# Patient Record
Sex: Female | Born: 2000 | Race: Black or African American | Hispanic: No | Marital: Single | Smoking: Never smoker
Health system: Southern US, Community
[De-identification: ages and names within clinical notes are randomized; demographics above are authoritative.]

## PROBLEM LIST (undated history)

## (undated) DIAGNOSIS — I10 Essential (primary) hypertension: Secondary | ICD-10-CM

## (undated) DIAGNOSIS — J45909 Unspecified asthma, uncomplicated: Secondary | ICD-10-CM

## (undated) HISTORY — PX: HERNIA REPAIR: SHX51

---
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
## 2019-01-31 DIAGNOSIS — B3731 Acute candidiasis of vulva and vagina: Secondary | ICD-10-CM | POA: Insufficient documentation

## 2020-11-22 DIAGNOSIS — Z3686 Encounter for antenatal screening for cervical length: Secondary | ICD-10-CM | POA: Diagnosis not present

## 2020-11-22 DIAGNOSIS — O26872 Cervical shortening, second trimester: Secondary | ICD-10-CM | POA: Diagnosis not present

## 2020-11-22 DIAGNOSIS — Z362 Encounter for other antenatal screening follow-up: Secondary | ICD-10-CM | POA: Diagnosis not present

## 2020-11-22 DIAGNOSIS — Z3A23 23 weeks gestation of pregnancy: Secondary | ICD-10-CM | POA: Diagnosis not present

## 2020-12-06 DIAGNOSIS — Z3686 Encounter for antenatal screening for cervical length: Secondary | ICD-10-CM | POA: Diagnosis not present

## 2020-12-06 DIAGNOSIS — O26872 Cervical shortening, second trimester: Secondary | ICD-10-CM | POA: Diagnosis not present

## 2020-12-06 DIAGNOSIS — Z3A25 25 weeks gestation of pregnancy: Secondary | ICD-10-CM | POA: Diagnosis not present

## 2020-12-08 ENCOUNTER — Ambulatory Visit
Admission: EM | Admit: 2020-12-08 | Discharge: 2020-12-08 | Disposition: A | Discharge status: Home / Self Care | Payer: 59

## 2020-12-08 ENCOUNTER — Ambulatory Visit: Payer: Self-pay

## 2020-12-08 ENCOUNTER — Other Ambulatory Visit: Payer: Self-pay

## 2020-12-08 DIAGNOSIS — J069 Acute upper respiratory infection, unspecified: Secondary | ICD-10-CM | POA: Diagnosis not present

## 2020-12-08 HISTORY — DX: Unspecified asthma, uncomplicated: J45.909

## 2020-12-08 MED ORDER — FLUTICASONE PROPIONATE 50 MCG/ACT NA SUSP: 1.0000 | Freq: Every day | NASAL | 0 refills | Status: DC

## 2020-12-08 MED ORDER — CETIRIZINE HCL 10 MG PO CAPS: 10.0000 mg | ORAL_CAPSULE | Freq: Every day | ORAL | 0 refills | Status: DC

## 2020-12-08 NOTE — Discharge Instructions (Signed)
Begin daily cetirizine, Flonase nasal spray to help with congestion and drainage, sinus inflammation May use other over-the-counter medicines as needed using list provided Tylenol as needed for headache, sore throat Rest and fluids Follow-up if not improving or worsening
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
## 2020-12-08 NOTE — ED Provider Notes (Signed)
[facility]    CSN: [id number] Arrival date & time: 12/08/20  0835      History   Chief Complaint Chief Complaint  Patient presents with  . Sore Throat    HPI Katherine Parrish is a 20 y.o. female approximately [redacted] weeks pregnant presenting today for evaluation of sore throat.  Reports began to develop postnasal drainage, sore throat congestion and sinus pressure yesterday.  Reports similar symptoms for approximately 4 days approximately 1 week ago.  Denies any known fevers.  Denies any significant cough chest pain or shortness of breath.   HPI  Past Medical History:  Diagnosis Date  . Asthma     There are no problems to display for this patient.   Past Surgical History:  Procedure Laterality Date  . HERNIA REPAIR      OB History    Gravida  1   Para      Term      Preterm      AB      Living        SAB      IAB      Ectopic      Multiple      Live Births               Home Medications    Prior to Admission medications   Medication Sig Start Date End Date Taking? Authorizing Provider  albuterol (VENTOLIN HFA) 108 (90 Base) MCG/ACT inhaler Inhale into the lungs every 6 (six) hours as needed for wheezing or shortness of breath.   Yes [provider]  Cetirizine HCl 10 MG CAPS Take 1 capsule (10 mg total) by mouth daily for 10 days. 12/08/20 12/18/20 Yes Rumor Sun C, PA-C  fluticasone (FLONASE) 50 MCG/ACT nasal spray Place 1-2 sprays into both nostrils daily. 12/08/20  Yes Thaddeaus Monica C, PA-C  montelukast (SINGULAIR) 10 MG tablet Take 10 mg by mouth at bedtime.   Yes [provider]  Prenatal Vit-Fe Fumarate-FA (PRENATAL MULTIVITAMIN) TABS tablet Take 1 tablet by mouth daily at 12 noon.   Yes [provider]    Family History History reviewed. No pertinent family history.  Social History Social History   Tobacco Use  . Smoking status: Never Smoker  . Smokeless tobacco: Never Used  Substance Use  Topics  . Alcohol use: No  . Drug use: No     Allergies   Patient has no known allergies.   Review of Systems Review of Systems  Constitutional: Negative for activity change, appetite change, chills, fatigue and fever.  HENT: Positive for congestion and sore throat. Negative for ear pain, rhinorrhea, sinus pressure and trouble swallowing.   Eyes: Negative for discharge and redness.  Respiratory: Negative for cough, chest tightness and shortness of breath.   Cardiovascular: Negative for chest pain.  Gastrointestinal: Negative for abdominal pain, diarrhea, nausea and vomiting.  Musculoskeletal: Negative for myalgias.  Skin: Negative for rash.  Neurological: Negative for dizziness, light-headedness and headaches.     Physical Exam Triage Vital Signs ED Triage Vitals  Enc Vitals Group     BP      Pulse      Resp      Temp      Temp src      SpO2      Weight      Height      Head Circumference      Peak Flow      Pain Score  Pain Loc      Pain Edu?      Excl. in GC?    No data found.  Updated Vital Signs BP 131/80 (BP Location: Left Arm)   Pulse 92   Temp 98.3 F (36.8 C) (Oral)   Resp 16   SpO2 98%   Visual Acuity Right Eye Distance:   Left Eye Distance:   Bilateral Distance:    Right Eye Near:   Left Eye Near:    Bilateral Near:     Physical Exam Vitals and nursing note reviewed.  Constitutional:      Appearance: She is well-developed.     Comments: No acute distress  HENT:     Head: Normocephalic and atraumatic.     Ears:     Comments: Bilateral ears without tenderness to palpation of external auricle, tragus and mastoid, EAC's without erythema or swelling, TM's with good bony landmarks and cone of light. Non erythematous.     Nose: Nose normal.     Mouth/Throat:     Comments: Oral mucosa pink and moist, no tonsillar enlargement or exudate. Posterior pharynx patent and nonerythematous, no uvula deviation or swelling. Normal phonation. Eyes:      Conjunctiva/sclera: Conjunctivae normal.  Cardiovascular:     Rate and Rhythm: Normal rate.  Pulmonary:     Effort: Pulmonary effort is normal. No respiratory distress.     Comments: Breathing comfortably at rest, CTABL, no wheezing, rales or other adventitious sounds auscultated Abdominal:     General: There is no distension.  Musculoskeletal:        General: Normal range of motion.     Cervical back: Neck supple.  Skin:    General: Skin is warm and dry.  Neurological:     Mental Status: She is alert and oriented to person, place, and time.      UC Treatments / Results  Labs (all labs ordered are listed, but only abnormal results are displayed) Labs Reviewed - No data to display  EKG   Radiology No results found.  Procedures Procedures (including critical care time)  Medications Ordered in UC Medications - No data to display  Initial Impression / Assessment and Plan / UC Course  I have reviewed the triage vital signs and the nursing notes.  Pertinent labs & imaging results that were available during my care of the patient were reviewed by me and considered in my medical decision making (see chart for details).     Suspect viral URI-symptoms x1 day, similar symptoms over 1 week ago, do not suspect double sickening suggestive of sinusitis at this time.  Recommending symptomatic and supportive care, provided list of medicines safe in pregnancy.  Provided recommendations.  Continue to monitor, push fluids.  Discussed strict return precautions. Patient verbalized understanding and is agreeable with plan.  Final Clinical Impressions(s) / UC Diagnoses   Final diagnoses:  Viral URI     Discharge Instructions     Begin daily cetirizine, Flonase nasal spray to help with congestion and drainage, sinus inflammation May use other over-the-counter medicines as needed using list provided Tylenol as needed for headache, sore throat Rest and fluids Follow-up if not  improving or worsening    ED Prescriptions    Medication Sig Dispense Auth. Provider   Cetirizine HCl 10 MG CAPS Take 1 capsule (10 mg total) by mouth daily for 10 days. 10 capsule Maliyah Willets C, PA-C   fluticasone (FLONASE) 50 MCG/ACT nasal spray Place 1-2 sprays into both nostrils  daily. 16 g Ruthvik Barnaby, [place] C, PA-C     PDMP not reviewed this encounter.   Lew Dawes, [place] 12/08/20 [phone]

## 2021-03-12 DIAGNOSIS — Z3A39 39 weeks gestation of pregnancy: Secondary | ICD-10-CM | POA: Diagnosis not present

## 2021-03-12 DIAGNOSIS — Z7982 Long term (current) use of aspirin: Secondary | ICD-10-CM | POA: Diagnosis not present

## 2021-03-12 DIAGNOSIS — O114 Pre-existing hypertension with pre-eclampsia, complicating childbirth: Secondary | ICD-10-CM | POA: Diagnosis not present

## 2021-03-12 DIAGNOSIS — Z8759 Personal history of other complications of pregnancy, childbirth and the puerperium: Secondary | ICD-10-CM | POA: Diagnosis not present

## 2021-03-12 DIAGNOSIS — Z8619 Personal history of other infectious and parasitic diseases: Secondary | ICD-10-CM | POA: Diagnosis not present

## 2021-03-12 DIAGNOSIS — Z8249 Family history of ischemic heart disease and other diseases of the circulatory system: Secondary | ICD-10-CM | POA: Diagnosis not present

## 2021-03-12 DIAGNOSIS — O99824 Streptococcus B carrier state complicating childbirth: Secondary | ICD-10-CM | POA: Diagnosis not present

## 2021-03-14 DIAGNOSIS — D696 Thrombocytopenia, unspecified: Secondary | ICD-10-CM | POA: Insufficient documentation

## 2021-03-14 DIAGNOSIS — O119 Pre-existing hypertension with pre-eclampsia, unspecified trimester: Secondary | ICD-10-CM | POA: Insufficient documentation

## 2021-03-14 HISTORY — DX: Pre-existing hypertension with pre-eclampsia, unspecified trimester: O11.9

## 2021-05-10 ENCOUNTER — Other Ambulatory Visit: Payer: Self-pay

## 2021-05-10 ENCOUNTER — Ambulatory Visit
Admission: EM | Admit: 2021-05-10 | Discharge: 2021-05-10 | Disposition: A | Discharge status: Home / Self Care | Payer: 59 | Attending: Emergency Medicine | Admitting: Emergency Medicine

## 2021-05-10 DIAGNOSIS — H61892 Other specified disorders of left external ear: Secondary | ICD-10-CM | POA: Diagnosis not present

## 2021-05-10 MED ORDER — MUPIROCIN 2 % EX OINT: 1.0000 "application " | TOPICAL_OINTMENT | Freq: Two times a day (BID) | CUTANEOUS | 0 refills | Status: DC

## 2021-05-10 MED ORDER — TRIAMCINOLONE ACETONIDE 0.1 % EX CREA: 1.0000 "application " | TOPICAL_CREAM | Freq: Two times a day (BID) | CUTANEOUS | 0 refills | Status: DC

## 2021-05-10 NOTE — Discharge Instructions (Addendum)
Please use triamcinolone cream twice daily to help with itching and dryness Bactroban antibiotic ointment twice daily to help prevent secondary infection from developing May continue with Aquaphor to further moisturize Tylenol and ibuprofen as needed for pain Follow-up if any symptoms not improving or worsening

## 2021-05-10 NOTE — ED Triage Notes (Signed)
Pt c/o bialteral ear pain, and irritation on right outer ear for two weeks.

## 2021-05-10 NOTE — ED Provider Notes (Signed)
[facility]    CSN: [id number] Arrival date & time: 05/10/21  1450      History   Chief Complaint Chief Complaint  Patient presents with   Otalgia    HPI Katherine Parrish is a 20 y.o. female history of asthma presenting today for evaluation of left ear pain.  Reports over the past few days she has had some dryness and itching to her left ear, tried using Aquaphor, noticed bloody scab today.  Slight symptoms on right, but main concern was left.  Denies changes in hearing.  Denies recent URI symptoms.  HPI  Past Medical History:  Diagnosis Date   Asthma     There are no problems to display for this patient.   Past Surgical History:  Procedure Laterality Date   HERNIA REPAIR      OB History     Gravida  1   Para      Term      Preterm      AB      Living         SAB      IAB      Ectopic      Multiple      Live Births               Home Medications    Prior to Admission medications   Medication Sig Start Date End Date Taking? Authorizing Provider  mupirocin ointment (BACTROBAN) 2 % Apply 1 application topically 2 (two) times daily. 05/10/21  Yes Gian Ybarra C, PA-C  triamcinolone cream (KENALOG) 0.1 % Apply 1 application topically 2 (two) times daily. 05/10/21  Yes Aleysha Meckler C, PA-C  albuterol (VENTOLIN HFA) 108 (90 Base) MCG/ACT inhaler Inhale into the lungs every 6 (six) hours as needed for wheezing or shortness of breath.    [provider]  Cetirizine HCl 10 MG CAPS Take 1 capsule (10 mg total) by mouth daily for 10 days. 12/08/20 12/18/20  Sharlynn Seckinger C, PA-C  fluticasone (FLONASE) 50 MCG/ACT nasal spray Place 1-2 sprays into both nostrils daily. 12/08/20   Talisha Erby C, PA-C  montelukast (SINGULAIR) 10 MG tablet Take 10 mg by mouth at bedtime.    [provider]  Prenatal Vit-Fe Fumarate-FA (PRENATAL MULTIVITAMIN) TABS tablet Take 1 tablet by mouth daily at 12 noon.    [provider]     Family History History reviewed. No pertinent family history.  Social History Social History   Tobacco Use   Smoking status: Never   Smokeless tobacco: Never  Substance Use Topics   Alcohol use: No   Drug use: No     Allergies   Patient has no known allergies.   Review of Systems Review of Systems  Constitutional:  Negative for activity change, appetite change, chills, fatigue and fever.  HENT:  Positive for ear pain. Negative for congestion, hearing loss, rhinorrhea, sinus pressure, sore throat and trouble swallowing.   Eyes:  Negative for discharge and redness.  Respiratory:  Negative for cough, chest tightness and shortness of breath.   Cardiovascular:  Negative for chest pain.  Gastrointestinal:  Negative for abdominal pain, diarrhea, nausea and vomiting.  Musculoskeletal:  Negative for myalgias.  Skin:  Negative for rash.  Neurological:  Negative for dizziness, light-headedness and headaches.    Physical Exam Triage Vital Signs ED Triage Vitals  Enc Vitals Group     BP 05/10/21 1520 130/70     Pulse Rate 05/10/21 1520 80  Resp 05/10/21 1520 18     Temp 05/10/21 1520 98.6 F (37 C)     Temp Source 05/10/21 1520 Oral     SpO2 05/10/21 1520 98 %     Weight --      Height --      Head Circumference --      Peak Flow --      Pain Score 05/10/21 1522 4     Pain Loc --      Pain Edu? --      Excl. in GC? --    No data found.  Updated Vital Signs BP 130/70 (BP Location: Right Arm)   Pulse 80   Temp 98.6 F (37 C) (Oral)   Resp 18   LMP 05/09/2021   SpO2 98%   Visual Acuity Right Eye Distance:   Left Eye Distance:   Bilateral Distance:    Right Eye Near:   Left Eye Near:    Bilateral Near:     Physical Exam Vitals and nursing note reviewed.  Constitutional:      Appearance: She is well-developed.     Comments: No acute distress  HENT:     Head: Normocephalic and atraumatic.     Ears:     Comments: Left tragus with dryness noted with  mild erythema, no induration  Bilateral canals without swelling or erythema, TMs intact bilaterally with good bony landmarks pearly gray and good cone of light    Nose: Nose normal.  Eyes:     Conjunctiva/sclera: Conjunctivae normal.  Cardiovascular:     Rate and Rhythm: Normal rate.  Pulmonary:     Effort: Pulmonary effort is normal. No respiratory distress.  Abdominal:     General: There is no distension.  Musculoskeletal:        General: Normal range of motion.     Cervical back: Neck supple.  Skin:    General: Skin is warm and dry.  Neurological:     Mental Status: She is alert and oriented to person, place, and time.     UC Treatments / Results  Labs (all labs ordered are listed, but only abnormal results are displayed) Labs Reviewed - No data to display  EKG   Radiology No results found.  Procedures Procedures (including critical care time)  Medications Ordered in UC Medications - No data to display  Initial Impression / Assessment and Plan / UC Course  I have reviewed the triage vital signs and the nursing notes.  Pertinent labs & imaging results that were available during my care of the patient were reviewed by me and considered in my medical decision making (see chart for details).     Left ear appears to have dry skin with associated excoriation noted, no signs of secondary bacterial infection or cellulitis associated with this.  At this time recommending Bactroban and triamcinolone topically, may continue to moisturize with Aquaphor.  Anti-inflammatories as needed for pain.  No sign of otitis externa or media at this time.  Deferring any oral antibiotics.  Discussed strict return precautions. Patient verbalized understanding and is agreeable with plan.  Final Clinical Impressions(s) / UC Diagnoses   Final diagnoses:  Dryness of left ear canal     Discharge Instructions      Please use triamcinolone cream twice daily to help with itching and  dryness Bactroban antibiotic ointment twice daily to help prevent secondary infection from developing May continue with Aquaphor to further moisturize Tylenol and ibuprofen as needed for  pain Follow-up if any symptoms not improving or worsening     ED Prescriptions     Medication Sig Dispense Auth. Provider   triamcinolone cream (KENALOG) 0.1 % Apply 1 application topically 2 (two) times daily. 30 g Marky Buresh C, PA-C   mupirocin ointment (BACTROBAN) 2 % Apply 1 application topically 2 (two) times daily. 30 g Mickell Birdwell, [place] C, PA-C      PDMP not reviewed this encounter.   Lew Dawes, PA-C 05/10/21 1525

## 2021-06-03 ENCOUNTER — Other Ambulatory Visit: Payer: Self-pay

## 2021-06-03 ENCOUNTER — Ambulatory Visit (INDEPENDENT_AMBULATORY_CARE_PROVIDER_SITE_OTHER): Payer: 59 | Admitting: Physician Assistant

## 2021-06-03 ENCOUNTER — Encounter: Payer: Self-pay | Admitting: Physician Assistant

## 2021-06-03 VITALS — BP 140/92 | HR 82 | Temp 98.2°F | Ht 67.0 in | Wt 207.0 lb

## 2021-06-03 DIAGNOSIS — J45909 Unspecified asthma, uncomplicated: Secondary | ICD-10-CM | POA: Insufficient documentation

## 2021-06-03 DIAGNOSIS — M542 Cervicalgia: Secondary | ICD-10-CM

## 2021-06-03 DIAGNOSIS — R030 Elevated blood-pressure reading, without diagnosis of hypertension: Secondary | ICD-10-CM

## 2021-06-03 DIAGNOSIS — F419 Anxiety disorder, unspecified: Secondary | ICD-10-CM

## 2021-06-03 DIAGNOSIS — R4689 Other symptoms and signs involving appearance and behavior: Secondary | ICD-10-CM | POA: Diagnosis not present

## 2021-06-03 DIAGNOSIS — J4520 Mild intermittent asthma, uncomplicated: Secondary | ICD-10-CM

## 2021-06-03 DIAGNOSIS — D696 Thrombocytopenia, unspecified: Secondary | ICD-10-CM

## 2021-06-03 MED ORDER — MONTELUKAST SODIUM 10 MG PO TABS: 10.0000 mg | ORAL_TABLET | Freq: Every day | ORAL | 1 refills | Status: DC

## 2021-06-03 MED ORDER — KETOCONAZOLE 2 % EX CREA: TOPICAL_CREAM | CUTANEOUS | 0 refills | Status: DC

## 2021-06-03 MED ORDER — SERTRALINE HCL 25 MG PO TABS: 25.0000 mg | ORAL_TABLET | Freq: Every day | ORAL | 1 refills | Status: DC

## 2021-06-03 NOTE — Patient Instructions (Addendum)
It was great to see you!  --Start zoloft 25 mg daily; tell someone you trust that you are starting this medication; let me know after   --Check BP 2-3 times a week, if remains > 140/90 -- please let me know  --Trial cervical towel stretches over the weekend; find me Monday so we can chat with Lauren  --Ibuprofen for your neck prn  --Use fungal ointment for your ears prn  --Update CBC when you can  --Singulair refill sent  Take care,  Jarold Motto PA-C

## 2021-06-03 NOTE — Progress Notes (Signed)
Katherine Parrish is a 20 y.o. female here to establish care.  History of Present Illness:   Chief Complaint  Patient presents with   Establish Care   Anxiety   Neck Pain   Depression   Thrombocytopenia Occurred post-pregnancy. Has never had issues with this before. Would like blood work checked. Denies excessive bleeding/bruising.  Anxiety She has had problems with becoming easily aggravated and it has increased postpartum. She considers her mother a support system, but feels she is a burden occasionally. She has insomnia secondary to anxiety and has worsened with time. Denies SI/HI. Has never been on medication.  Asthma/ Allergies She is currently using albuterol 108 (90 base) inhaler and has had no problems. Allergies have been relieved with Singulair 10 mg. She needs refill of singulair 10 mg daily.  Neck Pain She has been having a burning sensation in the back of her neck. She noticed it's worsened when holding her son. She has gotten a new mattress recently and hasn't added any heat or ice to the area. She reports ibuprofen relieves symptoms.   Ear Itching She noticed that the bottom of her ear was peeling and had a flaky scab in it. She has used triamcinolone cream to treat the area and found little relief. The peeling is slightly painful. Has also tried mupirocin with little relief. Uses ear buds but keeps them clean.  Elevated blood pressure Currently taking no medications. At home blood pressure readings are: not checked.. Patient denies chest pain, SOB, blurred vision, dizziness, unusual headaches, lower leg swelling. Denies excessive caffeine intake, stimulant usage, excessive alcohol intake, or increase in salt consumption.  BP Readings from Last 3 Encounters:  06/03/21 (!) 140/92  05/10/21 130/70  12/08/20 131/80    Health Maintenance: Immunizations -- HPV- Never done COVID-  01/22/20 overdue since 02/12/20 Flu Vaccine- 08/11/20 overdue since 04/18/21 Tdap- 12/22/20   Colonoscopy -- N/A Mammogram -- N/A  Bone Density -- N/A PSA -- N/A Diet -- Not discussed  Caffeine intake -- Not discussed Sleep habits -- Having trouble sleeping due to racing thoughts postpartum.  Wt Readings from Last 3 Encounters:  06/03/21 207 lb (93.9 kg)  09/20/17 191 lb 5.8 oz (86.8 kg) (97 %, Z= 1.89)*   * Growth percentiles are based on CDC (Girls, 2-20 Years) data.    Mood -- She had mood concerns before having her child, but anxiety has gotten worse postpartum   Depression screen PHQ 2/9 06/03/2021  Decreased Interest 1  Down, Depressed, Hopeless 1  PHQ - 2 Score 2  Altered sleeping 0  Tired, decreased energy 0  Change in appetite 1  Feeling bad or failure about yourself  2  Trouble concentrating 2  Moving slowly or fidgety/restless 0  Suicidal thoughts 0  PHQ-9 Score 7  Difficult doing work/chores Very difficult    GAD 7 : Generalized Anxiety Score 06/03/2021  Nervous, Anxious, on Edge 3  Control/stop worrying 2  Worry too much - different things 3  Trouble relaxing 0  Restless 0  Easily annoyed or irritable 3  Afraid - awful might happen 3  Total GAD 7 Score 14  Anxiety Difficulty Somewhat difficult     Other providers/specialists: Patient Care Team: Raynelle Jan., MD as PCP - General (Family Medicine)   Past Medical History:  Diagnosis Date   Asthma    Vaginal delivery 03/15/2021     Social History   Tobacco Use   Smoking status: Never   Smokeless tobacco: Never  Vaping Use   Vaping Use: Never used  Substance Use Topics   Alcohol use: No   Drug use: No    Past Surgical History:  Procedure Laterality Date   HERNIA REPAIR      Family History  Problem Relation Age of Onset   Uterine cancer Mother    Breast cancer Maternal Grandmother    Colon cancer Neg Hx     Allergies  Allergen Reactions   Aspirin Nausea Only     Current Medications:   Current Outpatient Medications:    albuterol (VENTOLIN HFA) 108 (90 Base)  MCG/ACT inhaler, Inhale into the lungs every 6 (six) hours as needed for wheezing or shortness of breath., Disp: , Rfl:    docusate sodium (COLACE) 100 MG capsule, Take 100 mg by mouth 2 (two) times daily as needed., Disp: , Rfl:    fluticasone (FLONASE) 50 MCG/ACT nasal spray, Place 1-2 sprays into both nostrils daily., Disp: 16 g, Rfl: 0   ibuprofen (ADVIL) 800 MG tablet, Take 800 mg by mouth every 8 (eight) hours as needed., Disp: , Rfl:    ketoconazole (NIZORAL) 2 % cream, Apply to affected area as needed, Disp: 15 g, Rfl: 0   montelukast (SINGULAIR) 10 MG tablet, Take 1 tablet (10 mg total) by mouth at bedtime., Disp: 90 tablet, Rfl: 1   mupirocin ointment (BACTROBAN) 2 %, Apply 1 application topically 2 (two) times daily., Disp: 30 g, Rfl: 0   Prenatal Vit-Fe Fumarate-FA (PRENATAL MULTIVITAMIN) TABS tablet, Take 1 tablet by mouth daily at 12 noon., Disp: , Rfl:    sertraline (ZOLOFT) 25 MG tablet, Take 1 tablet (25 mg total) by mouth daily., Disp: 30 tablet, Rfl: 1   triamcinolone cream (KENALOG) 0.1 %, Apply 1 application topically 2 (two) times daily., Disp: 30 g, Rfl: 0   Review of Systems:   ROS Negative unless otherwise specified per HPI.  Vitals:   Vitals:   06/03/21 1433  BP: (!) 140/92  Pulse: 82  Temp: 98.2 F (36.8 C)  TempSrc: Temporal  SpO2: 98%  Weight: 207 lb (93.9 kg)  Height: 5\' 7"  (1.702 m)      Body mass index is 32.42 kg/m.  Physical Exam:   Physical Exam Vitals and nursing note reviewed.  Constitutional:      General: She is not in acute distress.    Appearance: She is well-developed. She is not ill-appearing or toxic-appearing.  HENT:     Ears:     Comments: Slight flaking of skin in R auricle; no erythema or d/c Neck:     Comments: Tenderness on her cervical spine No paravertebral muscle tenderness  Normal range of motion, cervical spine   Cardiovascular:     Rate and Rhythm: Normal rate and regular rhythm.     Pulses: Normal pulses.      Heart sounds: Normal heart sounds, S1 normal and S2 normal.     Comments: No LE edema Pulmonary:     Effort: Pulmonary effort is normal.     Breath sounds: Normal breath sounds.  Skin:    General: Skin is warm and dry.  Neurological:     Mental Status: She is alert.     GCS: GCS eye subscore is 4. GCS verbal subscore is 5. GCS motor subscore is 6.  Psychiatric:        Speech: Speech normal.        Behavior: Behavior normal. Behavior is cooperative.    No results found for this  or any previous visit.  Assessment and Plan:   1. Thrombocytopenia (HCC) Update CBC Consider referral to hematology if indicated  2. Anxiety Uncontrolled Start zoloft 25 mg daily Follow-up in 1 month, sooner if concerns I discussed with patient that if they develop any SI, to tell someone immediately and seek medical attention.  3. Mild intermittent asthma without complication Well controlled, continue singulair and inhaler prn  4. Neck pain Suspect MSK related to caring for her newborn Cervical stretches h/o provided Consider ibuprofen prn Will reach out to my PT for further rec's  5. Elevated blood pressure reading Advised patient to keep eye on BP and let us know if >140/90 or sx develop Low threshold to start BP medication  6. Concern with appearance of ear Suspect possible fungal etiology Trial topical ketoconazole If lack of improvement, will refer to dermatology  I,Essence Turner,acting as a scribe for Jarold Motto, PA.,have documented all relevant documentation on the behalf of Jarold Motto, PA,as directed by  Jarold Motto, PA while in the presence of Jarold Motto, [name].   I, Jarold Motto, [name], have reviewed all documentation for this visit. The documentation on 06/03/21 for the exam, diagnosis, procedures, and orders are all accurate and complete.   Jarold Motto, PA-C

## 2021-06-06 NOTE — Addendum Note (Signed)
Addended by: Lorn Junes on: 06/06/2021 04:16 PM   Modules accepted: Orders

## 2021-06-06 NOTE — Addendum Note (Signed)
Addended by: Lorn Junes on: 06/06/2021 04:21 PM   Modules accepted: Orders

## 2021-06-06 NOTE — Addendum Note (Signed)
Addended by: Lorn Junes on: 06/06/2021 04:22 PM   Modules accepted: Orders

## 2021-06-07 ENCOUNTER — Other Ambulatory Visit: Payer: Self-pay | Admitting: Physician Assistant

## 2021-06-07 ENCOUNTER — Other Ambulatory Visit (INDEPENDENT_AMBULATORY_CARE_PROVIDER_SITE_OTHER): Payer: 59

## 2021-06-07 DIAGNOSIS — R030 Elevated blood-pressure reading, without diagnosis of hypertension: Secondary | ICD-10-CM | POA: Diagnosis not present

## 2021-06-07 LAB — CBC WITH DIFFERENTIAL/PLATELET
Basophils Absolute: 0.1 10*3/uL (ref 0.0–0.1)
Basophils Relative: 1.1 % (ref 0.0–3.0)
Eosinophils Absolute: 0.2 10*3/uL (ref 0.0–0.7)
Eosinophils Relative: 3.8 % (ref 0.0–5.0)
HCT: 33.6 % — ABNORMAL LOW (ref 36.0–46.0)
Hemoglobin: 10.7 g/dL — ABNORMAL LOW (ref 12.0–15.0)
Lymphocytes Relative: 47.5 % — ABNORMAL HIGH (ref 12.0–46.0)
Lymphs Abs: 2.2 10*3/uL (ref 0.7–4.0)
MCHC: 31.7 g/dL (ref 30.0–36.0)
MCV: 78.7 fl (ref 78.0–100.0)
Monocytes Absolute: 0.5 10*3/uL (ref 0.1–1.0)
Monocytes Relative: 10.1 % (ref 3.0–12.0)
Neutro Abs: 1.8 10*3/uL (ref 1.4–7.7)
Neutrophils Relative %: 37.5 % — ABNORMAL LOW (ref 43.0–77.0)
Platelets: 146 10*3/uL — ABNORMAL LOW (ref 150.0–400.0)
RBC: 4.28 Mil/uL (ref 3.87–5.11)
RDW: 17.8 % — ABNORMAL HIGH (ref 11.5–15.5)
WBC: 4.7 10*3/uL (ref 4.0–10.5)

## 2021-06-07 MED ORDER — HYDROCHLOROTHIAZIDE 12.5 MG PO CAPS: 12.5000 mg | ORAL_CAPSULE | Freq: Every day | ORAL | 1 refills | Status: DC

## 2021-06-08 LAB — PROTEIN / CREATININE RATIO, URINE
Creatinine, Urine: 222 mg/dL (ref 20–275)
Protein/Creat Ratio: 63 mg/g creat (ref 24–184)
Protein/Creatinine Ratio: 0.063 mg/mg creat (ref 0.024–0.184)
Total Protein, Urine: 14 mg/dL (ref 5–24)

## 2021-06-09 LAB — COMPREHENSIVE METABOLIC PANEL
ALT: 18 U/L (ref 0–35)
AST: 26 U/L (ref 0–37)
Albumin: 4.3 g/dL (ref 3.5–5.2)
Alkaline Phosphatase: 71 U/L (ref 39–117)
BUN: 9 mg/dL (ref 6–23)
CO2: 26 mEq/L (ref 19–32)
Calcium: 9.5 mg/dL (ref 8.4–10.5)
Chloride: 103 mEq/L (ref 96–112)
Creatinine, Ser: 0.8 mg/dL (ref 0.40–1.20)
GFR: 105.56 mL/min (ref >60.00)
Glucose, Bld: 104 mg/dL — ABNORMAL HIGH (ref 70–99)
Potassium: 3.5 mEq/L (ref 3.5–5.1)
Sodium: 138 mEq/L (ref 135–145)
Total Bilirubin: 0.2 mg/dL (ref 0.2–1.2)
Total Protein: 7.8 g/dL (ref 6.0–8.3)

## 2021-06-25 ENCOUNTER — Other Ambulatory Visit: Payer: Self-pay | Admitting: Physician Assistant

## 2021-06-29 ENCOUNTER — Other Ambulatory Visit: Payer: Self-pay | Admitting: Physician Assistant

## 2021-07-13 ENCOUNTER — Ambulatory Visit (INDEPENDENT_AMBULATORY_CARE_PROVIDER_SITE_OTHER): Payer: 59 | Admitting: Physician Assistant

## 2021-07-13 ENCOUNTER — Encounter: Payer: Self-pay | Admitting: Physician Assistant

## 2021-07-13 ENCOUNTER — Other Ambulatory Visit (HOSPITAL_BASED_OUTPATIENT_CLINIC_OR_DEPARTMENT_OTHER): Payer: Self-pay

## 2021-07-13 VITALS — BP 112/80 | HR 69 | Temp 98.1°F | Ht 67.0 in | Wt 202.0 lb

## 2021-07-13 DIAGNOSIS — R0981 Nasal congestion: Secondary | ICD-10-CM | POA: Diagnosis not present

## 2021-07-13 MED ORDER — AZITHROMYCIN 250 MG PO TABS
ORAL_TABLET | ORAL | 0 refills | Status: AC
  Filled 2021-07-13: qty 6, 5d supply, fill #0

## 2021-07-13 NOTE — Progress Notes (Signed)
Katherine Parrish is a 20 y.o. female here for sinus issues.  History of Present Illness:   Chief Complaint  Patient presents with   Sinus Problem    Pt c/o nasal congestion, sinus pressure, ears are popping. Shew has been having symptoms x 1 week. Home COVID test Neg yesterday. Son Dx with RSV last Wed.   Sinus Infection Pt c/o nasal congestion, sinus pressure, and popping of ears since last Wednesday when her infant son was dx with RSV. She has also been experiencing yellow mucus and sinus drainage into her throat.  Since experiencing sx she has tried using OTC medications such as mucinex all in one and robitussin severe which provided minor relief. Tiye has expressed her right ear is causing her more pain than her left. At home COVID tested was negative yesterday.   Denies: CP, SOB, worsening asthma  Past Medical History:  Diagnosis Date   Asthma    Vaginal delivery 03/15/2021     Social History   Tobacco Use   Smoking status: Never   Smokeless tobacco: Never  Vaping Use   Vaping Use: Never used  Substance Use Topics   Alcohol use: No   Drug use: No    Past Surgical History:  Procedure Laterality Date   HERNIA REPAIR      Family History  Problem Relation Age of Onset   Uterine cancer Mother    Breast cancer Maternal Grandmother    Colon cancer Neg Hx     Allergies  Allergen Reactions   Aspirin Nausea Only    Current Medications:   Current Outpatient Medications:    albuterol (VENTOLIN HFA) 108 (90 Base) MCG/ACT inhaler, Inhale into the lungs every 6 (six) hours as needed for wheezing or shortness of breath., Disp: , Rfl:    docusate sodium (COLACE) 100 MG capsule, Take 100 mg by mouth 2 (two) times daily as needed., Disp: , Rfl:    fluticasone (FLONASE) 50 MCG/ACT nasal spray, Place 1-2 sprays into both nostrils daily., Disp: 16 g, Rfl: 0   hydrochlorothiazide (MICROZIDE) 12.5 MG capsule, TAKE 1 CAPSULE BY MOUTH EVERY DAY, Disp: 30 capsule, Rfl: 1   ibuprofen  (ADVIL) 800 MG tablet, Take 800 mg by mouth every 8 (eight) hours as needed., Disp: , Rfl:    montelukast (SINGULAIR) 10 MG tablet, Take 1 tablet (10 mg total) by mouth at bedtime., Disp: 90 tablet, Rfl: 1   Prenatal Vit-Fe Fumarate-FA (PRENATAL MULTIVITAMIN) TABS tablet, Take 1 tablet by mouth daily at 12 noon., Disp: , Rfl:    sertraline (ZOLOFT) 25 MG tablet, TAKE 1 TABLET (25 MG TOTAL) BY MOUTH DAILY., Disp: 90 tablet, Rfl: 1   Review of Systems:   ROS Negative unless otherwise specified per HPI. Vitals:   Vitals:   07/13/21 1150  BP: 112/80  Pulse: 69  Temp: 98.1 F (36.7 C)  TempSrc: Temporal  SpO2: 97%  Weight: 202 lb (91.6 kg)  Height: 5\' 7"  (1.702 m)     Body mass index is 31.64 kg/m.  Physical Exam:   Physical Exam Vitals and nursing note reviewed.  Constitutional:      General: She is not in acute distress.    Appearance: She is well-developed. She is not ill-appearing or toxic-appearing.  HENT:     Head: Normocephalic and atraumatic.     Right Ear: Ear canal and external ear normal. A middle ear effusion is present. Tympanic membrane is not erythematous, retracted or bulging.     Left Ear:  Hearing, tympanic membrane, ear canal and external ear normal. Tympanic membrane is not erythematous, retracted or bulging.     Nose: Nose normal.     Right Sinus: No maxillary sinus tenderness or frontal sinus tenderness.     Left Sinus: No maxillary sinus tenderness or frontal sinus tenderness.     Mouth/Throat:     Pharynx: Uvula midline. Posterior oropharyngeal erythema present. No oropharyngeal exudate.     Tonsils: No tonsillar exudate. 2+ on the right. 2+ on the left.  Eyes:     General: Lids are normal.     Conjunctiva/sclera: Conjunctivae normal.  Neck:     Trachea: Trachea normal.  Cardiovascular:     Rate and Rhythm: Normal rate and regular rhythm.     Heart sounds: Normal heart sounds, S1 normal and S2 normal.  Pulmonary:     Effort: Pulmonary effort is  normal.     Breath sounds: Normal breath sounds. No decreased breath sounds, wheezing, rhonchi or rales.  Lymphadenopathy:     Cervical: No cervical adenopathy.  Skin:    General: Skin is warm and dry.  Neurological:     Mental Status: She is alert.  Psychiatric:        Speech: Speech normal.        Behavior: Behavior normal. Behavior is cooperative.    Assessment and Plan:   Sinus congestion No red flags on exam.  Will initiate azithromycin per orders. Discussed taking medications as prescribed. Reviewed return precautions including worsening fever, SOB, worsening cough or other concerns. Push fluids and rest. I recommend that patient follow-up if symptoms worsen or persist despite treatment x 7-10 days, sooner if needed.  I,Havlyn C Ratchford,acting as a Neurosurgeon for [employer], PA.,have documented all relevant documentation on the behalf of Jarold Motto, PA,as directed by  Jarold Motto, PA while in the presence of Jarold Motto, [name].  I, Jarold Motto, [name], have reviewed all documentation for this visit. The documentation on 07/13/21 for the exam, diagnosis, procedures, and orders are all accurate and complete.  Jarold Motto, PA-C

## 2021-08-02 ENCOUNTER — Other Ambulatory Visit: Payer: Self-pay | Admitting: Physician Assistant

## 2021-08-02 ENCOUNTER — Encounter: Payer: Self-pay | Admitting: Physician Assistant

## 2021-08-02 DIAGNOSIS — Z113 Encounter for screening for infections with a predominantly sexual mode of transmission: Secondary | ICD-10-CM | POA: Diagnosis not present

## 2021-08-02 DIAGNOSIS — I10 Essential (primary) hypertension: Secondary | ICD-10-CM | POA: Diagnosis not present

## 2021-08-02 DIAGNOSIS — Z3009 Encounter for other general counseling and advice on contraception: Secondary | ICD-10-CM | POA: Diagnosis not present

## 2021-08-03 ENCOUNTER — Other Ambulatory Visit: Payer: Self-pay | Admitting: Physician Assistant

## 2021-08-04 ENCOUNTER — Other Ambulatory Visit: Payer: Self-pay | Admitting: Physician Assistant

## 2021-08-04 ENCOUNTER — Other Ambulatory Visit (HOSPITAL_BASED_OUTPATIENT_CLINIC_OR_DEPARTMENT_OTHER): Payer: Self-pay

## 2021-08-04 MED ORDER — PREDNISONE 50 MG PO TABS
ORAL_TABLET | ORAL | 0 refills | Status: DC
  Filled 2021-08-04: qty 5, 5d supply, fill #0

## 2021-08-04 MED ORDER — ALBUTEROL SULFATE HFA 108 (90 BASE) MCG/ACT IN AERS
1.0000 | INHALATION_SPRAY | Freq: Four times a day (QID) | RESPIRATORY_TRACT | 1 refills | Status: DC | PRN
  Filled 2021-08-04: qty 18, 25d supply, fill #0

## 2021-08-04 NOTE — Telephone Encounter (Signed)
Last refilled done by historical provider  °

## 2021-08-23 ENCOUNTER — Other Ambulatory Visit (HOSPITAL_BASED_OUTPATIENT_CLINIC_OR_DEPARTMENT_OTHER): Payer: Self-pay

## 2021-08-23 ENCOUNTER — Ambulatory Visit: Payer: 59 | Admitting: Physician Assistant

## 2021-08-23 ENCOUNTER — Encounter: Payer: Self-pay | Admitting: Physician Assistant

## 2021-08-23 VITALS — BP 138/86 | HR 93 | Temp 98.1°F | Ht 67.0 in | Wt 207.0 lb

## 2021-08-23 DIAGNOSIS — R030 Elevated blood-pressure reading, without diagnosis of hypertension: Secondary | ICD-10-CM | POA: Diagnosis not present

## 2021-08-23 DIAGNOSIS — F419 Anxiety disorder, unspecified: Secondary | ICD-10-CM | POA: Diagnosis not present

## 2021-08-23 DIAGNOSIS — F53 Postpartum depression: Secondary | ICD-10-CM | POA: Diagnosis not present

## 2021-08-23 MED ORDER — SERTRALINE HCL 50 MG PO TABS
50.0000 mg | ORAL_TABLET | Freq: Every day | ORAL | 0 refills | Status: DC
  Filled 2021-08-23: qty 90, 90d supply, fill #0

## 2021-08-23 NOTE — Progress Notes (Signed)
Katherine Parrish is a 20 y.o. female here for a follow up of a pre-existing problem.  History of Present Illness:   Chief Complaint  Patient presents with   Anxiety   Depression    Discuss anxiety and depression, possibly postpartum     HPI  PPD and Anxiety She has been taking zoloft 25 mg daily since our last visit on 06/03/21. Tolerating well. Felt like it helped at first but now she is finding herself getting more irritated. She has had recent reduction in sleep due to her baby teething and thinks poor sleep is contributing to her symptoms. She denies SI/HI. Experiencing lack of motivation, overeating, poor concentration.   Elevated blood pressure reading Currently taking HCTZ 12.5 mg. At home blood pressure readings are: <140/80. Patient denies chest pain, SOB, blurred vision, dizziness, unusual headaches, lower leg swelling. Patient is compliant with medication. Denies excessive caffeine intake, stimulant usage, excessive alcohol intake, or increase in salt consumption.  BP Readings from Last 3 Encounters:  08/23/21 138/86  07/13/21 112/80  06/03/21 (!) 140/92     Depression screen PHQ 2/9 08/23/2021 06/03/2021  Decreased Interest 2 1  Down, Depressed, Hopeless 3 1  PHQ - 2 Score 5 2  Altered sleeping 3 0  Tired, decreased energy 3 0  Change in appetite 2 1  Feeling bad or failure about yourself  3 2  Trouble concentrating 2 2  Moving slowly or fidgety/restless 0 0  Suicidal thoughts 0 0  PHQ-9 Score 18 7  Difficult doing work/chores Very difficult Very difficult      Past Medical History:  Diagnosis Date   Asthma    Vaginal delivery 03/15/2021     Social History   Tobacco Use   Smoking status: Never   Smokeless tobacco: Never  Vaping Use   Vaping Use: Never used  Substance Use Topics   Alcohol use: No   Drug use: No    Past Surgical History:  Procedure Laterality Date   HERNIA REPAIR      Family History  Problem Relation Age of Onset   Uterine  cancer Mother    Breast cancer Maternal Grandmother    Colon cancer Neg Hx     Allergies  Allergen Reactions   Aspirin Nausea Only    Current Medications:   Current Outpatient Medications:    albuterol (VENTOLIN HFA) 108 (90 Base) MCG/ACT inhaler, Inhale 1-2 puffs into the lungs every 6 (six) hours as needed for wheezing or shortness of breath., Disp: 18 g, Rfl: 1   docusate sodium (COLACE) 100 MG capsule, Take 100 mg by mouth 2 (two) times daily as needed., Disp: , Rfl:    hydrochlorothiazide (MICROZIDE) 12.5 MG capsule, TAKE 1 CAPSULE BY MOUTH EVERY DAY, Disp: 90 capsule, Rfl: 1   ibuprofen (ADVIL) 800 MG tablet, Take 800 mg by mouth every 8 (eight) hours as needed., Disp: , Rfl:    montelukast (SINGULAIR) 10 MG tablet, Take 1 tablet (10 mg total) by mouth at bedtime., Disp: 90 tablet, Rfl: 1   sertraline (ZOLOFT) 25 MG tablet, TAKE 1 TABLET (25 MG TOTAL) BY MOUTH DAILY., Disp: 90 tablet, Rfl: 1   triamcinolone cream (KENALOG) 0.1 %, Apply topically., Disp: , Rfl:    fluticasone (FLONASE) 50 MCG/ACT nasal spray, Place 1-2 sprays into both nostrils daily. (Patient not taking: Reported on 08/23/2021), Disp: 16 g, Rfl: 0   predniSONE (DELTASONE) 50 MG tablet, Take 1 tablet by mouth daily for 5 days (Patient not taking: Reported  on 08/23/2021), Disp: 5 tablet, Rfl: 0   Prenatal Vit-Fe Fumarate-FA (PRENATAL MULTIVITAMIN) TABS tablet, Take 1 tablet by mouth daily at 12 noon. (Patient not taking: Reported on 08/23/2021), Disp: , Rfl:    Review of Systems:   ROS Negative unless otherwise specified per HPI.   Vitals:   Vitals:   08/23/21 1211  BP: 138/86  Pulse: 93  Temp: 98.1 F (36.7 C)  TempSrc: Temporal  SpO2: 98%  Weight: 207 lb (93.9 kg)  Height: 5\' 7"  (1.702 m)     Body mass index is 32.42 kg/m.  Physical Exam:   Physical Exam Vitals and nursing note reviewed.  Constitutional:      General: She is not in acute distress.    Appearance: She is well-developed. She is not  ill-appearing or toxic-appearing.  Cardiovascular:     Rate and Rhythm: Normal rate and regular rhythm.     Pulses: Normal pulses.     Heart sounds: Normal heart sounds, S1 normal and S2 normal.  Pulmonary:     Effort: Pulmonary effort is normal.     Breath sounds: Normal breath sounds.  Skin:    General: Skin is warm and dry.  Neurological:     Mental Status: She is alert.     GCS: GCS eye subscore is 4. GCS verbal subscore is 5. GCS motor subscore is 6.  Psychiatric:        Speech: Speech normal.        Behavior: Behavior normal. Behavior is cooperative.    Assessment and Plan:   Postpartum depression; Anxiety Uncontrolled Increase Zoloft to 50 mg daily I discussed with patient that if they develop any SI, to tell someone immediately and seek medical attention. Talk therapy referral placed Emotional support provided Follow-up in 1 month, sooner if concerns  Elevated blood pressure reading Normotensive Continue current plan of HCTZ 12.5 mg daily Follow-up in 3 months, sooner if concerns  , PA-C

## 2021-08-24 ENCOUNTER — Other Ambulatory Visit: Payer: Self-pay | Admitting: Physician Assistant

## 2021-08-25 ENCOUNTER — Other Ambulatory Visit (HOSPITAL_BASED_OUTPATIENT_CLINIC_OR_DEPARTMENT_OTHER): Payer: Self-pay

## 2021-08-25 MED ORDER — DOCUSATE SODIUM 100 MG PO CAPS
100.0000 mg | ORAL_CAPSULE | Freq: Two times a day (BID) | ORAL | 1 refills | Status: DC | PRN
  Filled 2021-08-25: qty 100, 50d supply, fill #0

## 2021-09-08 ENCOUNTER — Other Ambulatory Visit (HOSPITAL_BASED_OUTPATIENT_CLINIC_OR_DEPARTMENT_OTHER): Payer: Self-pay

## 2021-09-17 ENCOUNTER — Telehealth: Payer: 59 | Admitting: Physician Assistant

## 2021-09-17 ENCOUNTER — Telehealth: Payer: 59

## 2021-09-17 DIAGNOSIS — B9689 Other specified bacterial agents as the cause of diseases classified elsewhere: Secondary | ICD-10-CM | POA: Diagnosis not present

## 2021-09-17 DIAGNOSIS — J208 Acute bronchitis due to other specified organisms: Secondary | ICD-10-CM

## 2021-09-17 MED ORDER — BENZONATATE 100 MG PO CAPS: 100.0000 mg | ORAL_CAPSULE | Freq: Three times a day (TID) | ORAL | 0 refills | Status: DC | PRN

## 2021-09-17 MED ORDER — AZITHROMYCIN 250 MG PO TABS: ORAL_TABLET | ORAL | 0 refills | Status: AC

## 2021-09-17 NOTE — Progress Notes (Signed)

## 2021-09-17 NOTE — Progress Notes (Signed)
I have spent 5 minutes in review of e-visit questionnaire, review and updating patient chart, medical decision making and response to patient.   Adiel Mcnamara Cody Rodina Pinales, PA-C    

## 2021-10-03 ENCOUNTER — Encounter: Payer: Self-pay | Admitting: Physician Assistant

## 2021-10-04 ENCOUNTER — Other Ambulatory Visit (HOSPITAL_BASED_OUTPATIENT_CLINIC_OR_DEPARTMENT_OTHER): Payer: Self-pay

## 2021-10-04 MED ORDER — MONTELUKAST SODIUM 10 MG PO TABS
10.0000 mg | ORAL_TABLET | Freq: Every day | ORAL | 3 refills | Status: DC
  Filled 2021-10-04: qty 90, 90d supply, fill #0
  Filled 2022-01-27: qty 90, 90d supply, fill #1

## 2021-10-05 ENCOUNTER — Other Ambulatory Visit: Payer: Self-pay | Admitting: Physician Assistant

## 2021-10-05 ENCOUNTER — Other Ambulatory Visit (HOSPITAL_BASED_OUTPATIENT_CLINIC_OR_DEPARTMENT_OTHER): Payer: Self-pay

## 2021-10-05 MED ORDER — FLUTICASONE PROPIONATE (INHAL) 100 MCG/ACT IN AEPB
1.0000 | INHALATION_SPRAY | Freq: Two times a day (BID) | RESPIRATORY_TRACT | 1 refills | Status: DC
  Filled 2021-10-05: qty 60, 30d supply, fill #0

## 2021-10-05 MED ORDER — FLUTICASONE PROPIONATE HFA 110 MCG/ACT IN AERO
2.0000 | INHALATION_SPRAY | Freq: Two times a day (BID) | RESPIRATORY_TRACT | 1 refills | Status: DC
  Filled 2021-10-05: qty 12, 30d supply, fill #0

## 2021-10-06 ENCOUNTER — Other Ambulatory Visit (HOSPITAL_BASED_OUTPATIENT_CLINIC_OR_DEPARTMENT_OTHER): Payer: Self-pay

## 2021-10-13 LAB — HIV ANTIBODY (ROUTINE TESTING W REFLEX): HIV 1&2 Ab, 4th Generation: NONREACTIVE

## 2021-10-13 LAB — TSH: TSH: 0.6 (ref <=5.90)

## 2021-10-13 LAB — HM HIV SCREENING LAB: HM HIV Screening: NEGATIVE

## 2021-10-13 LAB — HEPATITIS B SURFACE ANTIGEN: Hepatitis B Surface Ag: NEGATIVE

## 2021-10-13 LAB — CBC AND DIFFERENTIAL
HCT: 38 (ref 36–46)
Hemoglobin: 12 (ref 12.0–16.0)
Platelets: 182 10*3/uL (ref 150–400)
WBC: 5.6

## 2021-10-13 LAB — HM HEPATITIS C SCREENING LAB: HM Hepatitis Screen: NEGATIVE

## 2021-10-13 LAB — VITAMIN D 25 HYDROXY (VIT D DEFICIENCY, FRACTURES): Vit D, 25-Hydroxy: 9

## 2021-10-13 LAB — HM PAP SMEAR

## 2021-10-13 LAB — CBC: RBC: 4.6 (ref 3.87–5.11)

## 2021-10-17 ENCOUNTER — Ambulatory Visit: Payer: Self-pay | Admitting: Professional

## 2021-10-18 LAB — OB RESULTS CONSOLE GC/CHLAMYDIA: Chlamydia: NEGATIVE

## 2021-11-01 ENCOUNTER — Ambulatory Visit: Payer: 59 | Admitting: Professional

## 2021-12-14 ENCOUNTER — Encounter: Payer: Self-pay | Admitting: Physician Assistant

## 2021-12-14 ENCOUNTER — Ambulatory Visit (INDEPENDENT_AMBULATORY_CARE_PROVIDER_SITE_OTHER): Payer: No Typology Code available for payment source | Admitting: Physician Assistant

## 2021-12-14 VITALS — BP 130/90 | HR 78 | Temp 97.1°F | Ht 67.0 in | Wt 203.8 lb

## 2021-12-14 DIAGNOSIS — E559 Vitamin D deficiency, unspecified: Secondary | ICD-10-CM | POA: Diagnosis not present

## 2021-12-14 DIAGNOSIS — F53 Postpartum depression: Secondary | ICD-10-CM | POA: Diagnosis not present

## 2021-12-14 DIAGNOSIS — R5383 Other fatigue: Secondary | ICD-10-CM

## 2021-12-14 DIAGNOSIS — R030 Elevated blood-pressure reading, without diagnosis of hypertension: Secondary | ICD-10-CM | POA: Diagnosis not present

## 2021-12-14 DIAGNOSIS — F419 Anxiety disorder, unspecified: Secondary | ICD-10-CM | POA: Diagnosis not present

## 2021-12-14 LAB — CBC WITH DIFFERENTIAL/PLATELET
Basophils Absolute: 0 10*3/uL (ref 0.0–0.1)
Basophils Relative: 0.3 % (ref 0.0–3.0)
Eosinophils Absolute: 0.2 10*3/uL (ref 0.0–0.7)
Eosinophils Relative: 5.9 % — ABNORMAL HIGH (ref 0.0–5.0)
HCT: 34.6 % — ABNORMAL LOW (ref 36.0–46.0)
Hemoglobin: 11.1 g/dL — ABNORMAL LOW (ref 12.0–15.0)
Lymphocytes Relative: 46.2 % — ABNORMAL HIGH (ref 12.0–46.0)
Lymphs Abs: 1.8 10*3/uL (ref 0.7–4.0)
MCHC: 32.1 g/dL (ref 30.0–36.0)
MCV: 80.3 fl (ref 78.0–100.0)
Monocytes Absolute: 0.3 10*3/uL (ref 0.1–1.0)
Monocytes Relative: 8 % (ref 3.0–12.0)
Neutro Abs: 1.6 10*3/uL (ref 1.4–7.7)
Neutrophils Relative %: 39.6 % — ABNORMAL LOW (ref 43.0–77.0)
Platelets: 148 10*3/uL — ABNORMAL LOW (ref 150.0–400.0)
RBC: 4.31 Mil/uL (ref 3.87–5.11)
RDW: 18.2 % — ABNORMAL HIGH (ref 11.5–15.5)
WBC: 3.9 10*3/uL — ABNORMAL LOW (ref 4.0–10.5)

## 2021-12-14 LAB — IBC + FERRITIN
Ferritin: 9.6 ng/mL — ABNORMAL LOW (ref 10.0–291.0)
Iron: 43 ug/dL (ref 42–145)
Saturation Ratios: 11 % — ABNORMAL LOW (ref 20.0–50.0)
TIBC: 392 ug/dL (ref 250.0–450.0)
Transferrin: 280 mg/dL (ref 212.0–360.0)

## 2021-12-14 LAB — VITAMIN D 25 HYDROXY (VIT D DEFICIENCY, FRACTURES): VITD: 21.02 ng/mL — ABNORMAL LOW (ref 30.00–100.00)

## 2021-12-14 LAB — COMPREHENSIVE METABOLIC PANEL
ALT: 11 U/L (ref 0–35)
AST: 19 U/L (ref 0–37)
Albumin: 4.3 g/dL (ref 3.5–5.2)
Alkaline Phosphatase: 60 U/L (ref 39–117)
BUN: 19 mg/dL (ref 6–23)
CO2: 26 mEq/L (ref 19–32)
Calcium: 9.3 mg/dL (ref 8.4–10.5)
Chloride: 105 mEq/L (ref 96–112)
Creatinine, Ser: 0.66 mg/dL (ref 0.40–1.20)
GFR: 125.22 mL/min (ref >60.00)
Glucose, Bld: 84 mg/dL (ref 70–99)
Potassium: 3.8 mEq/L (ref 3.5–5.1)
Sodium: 139 mEq/L (ref 135–145)
Total Bilirubin: 0.4 mg/dL (ref 0.2–1.2)
Total Protein: 7.3 g/dL (ref 6.0–8.3)

## 2021-12-14 LAB — VITAMIN B12: Vitamin B-12: 257 pg/mL (ref 211–911)

## 2021-12-14 MED ORDER — SERTRALINE HCL 100 MG PO TABS: 100.0000 mg | ORAL_TABLET | Freq: Every day | ORAL | 1 refills | Status: DC

## 2021-12-14 NOTE — Progress Notes (Signed)
Katherine Parrish is a 20 y.o. female here for a follow up of a pre-existing problem. ? ?History of Present Illness:  ? ?Chief Complaint  ?Patient presents with  ? Anxiety  ? Depression  ? Hypertension  ? ?Anxiety/ Post-partum Depression ?Katherine Parrish is currently compliant with taking zoloft 50 mg daily with no adverse effects. Although she has been compliant with the medication, she is still feeling a decreased mood due to the recent passing of her mother. States that since she knew it was coming, she was better able to prepare for this. Aside from wanting to stay busy and carry on like her mother would want her to, she has taken needed time off from work and has restarted participating in talk therapy. This was a big step for her since she is not one to open up about how she is feeling most of the time. Despite feeling that her worsening decreased mood is strictly grief based, she is interested in increasing her dosage at this time. Denies SI/HI.  ? ?Elevated BP Reading ?Pt was previously compliant with taking microzide 12.5 mg daily, but has stopped use as of a couple of months ago.  She has been forgetting to take this. At home blood pressure readings are: not checked. Patient denies chest pain, SOB, blurred vision, dizziness, unusual headaches, lower leg swelling. Denies excessive caffeine intake, stimulant usage, excessive alcohol intake, or increase in salt consumption. ? ?BP Readings from Last 3 Encounters:  ?12/14/21 130/90  ?08/23/21 138/86  ?07/13/21 112/80  ? ?Vitamin D Deficiency  ?Following routine labs completed on 10/13/21 by her gynecologist, pt was found to have a vitamin d level of 9. She was given high dose Vit D supplement for 7 weeks and took this as prescribed. She is currently not taking an OTC supplement and would like to re-check these levels to see if she should start. Denies concerning sx.  ? ?Fatigue  ?Although pt is used to experiencing fatigue due to her work schedule and being a mom, she would  like to check her B12 levels to make sure nothing significant is occurring. Pt is currently not taking any B12 supplements at this time. Denies concerning sx  ? ?Past Medical History:  ?Diagnosis Date  ? Asthma   ? Vaginal delivery 03/15/2021  ? ?  ?Social History  ? ?Tobacco Use  ? Smoking status: Never  ? Smokeless tobacco: Never  ?Vaping Use  ? Vaping Use: Never used  ?Substance Use Topics  ? Alcohol use: No  ? Drug use: No  ? ? ?Past Surgical History:  ?Procedure Laterality Date  ? HERNIA REPAIR    ? ? ?Family History  ?Problem Relation Age of Onset  ? Uterine cancer Mother   ? Breast cancer Maternal Grandmother   ? Colon cancer Neg Hx   ? ? ?Allergies  ?Allergen Reactions  ? Aspirin Nausea Only  ? ? ?Current Medications:  ? ?Current Outpatient Medications:  ?  albuterol (VENTOLIN HFA) 108 (90 Base) MCG/ACT inhaler, Inhale 1-2 puffs into the lungs every 6 (six) hours as needed for wheezing or shortness of breath., Disp: 18 g, Rfl: 1 ?  ibuprofen (ADVIL) 800 MG tablet, Take 800 mg by mouth every 8 (eight) hours as needed., Disp: , Rfl:  ?  montelukast (SINGULAIR) 10 MG tablet, Take 1 tablet (10 mg total) by mouth at bedtime., Disp: 90 tablet, Rfl: 3 ?  sertraline (ZOLOFT) 50 MG tablet, Take 1 tablet (50 mg total) by mouth daily., Disp: 90  tablet, Rfl: 0 ?  benzonatate (TESSALON) 100 MG capsule, Take 1 capsule (100 mg total) by mouth 3 (three) times daily as needed for cough., Disp: 30 capsule, Rfl: 0 ?  docusate sodium (COLACE) 100 MG capsule, Take 1 capsule (100 mg total) by mouth 2 (two) times daily as needed., Disp: 100 capsule, Rfl: 1 ?  fluticasone (FLONASE) 50 MCG/ACT nasal spray, Place 1-2 sprays into both nostrils daily. (Patient not taking: Reported on 08/23/2021), Disp: 16 g, Rfl: 0 ?  hydrochlorothiazide (MICROZIDE) 12.5 MG capsule, TAKE 1 CAPSULE BY MOUTH EVERY DAY, Disp: 90 capsule, Rfl: 1 ?  triamcinolone cream (KENALOG) 0.1 %, Apply topically., Disp: , Rfl:   ? ?Review of Systems:  ? ?Review of  Systems  ?Psychiatric/Behavioral:  Positive for depression.   ?Negative unless otherwise specified per HPI. ?Vitals:  ? ?Vitals:  ? 12/14/21 1016  ?BP: 130/90  ?Pulse: 78  ?Temp: (!) 97.1 ?F (36.2 ?C)  ?SpO2: 97%  ?Weight: 203 lb 12.8 oz (92.4 kg)  ?Height: 5\' 7"  (1.702 m)  ?   ?Body mass index is 31.92 kg/m?. ? ?Physical Exam:  ? ?Physical Exam ?Vitals and nursing note reviewed.  ?Constitutional:   ?   General: She is not in acute distress. ?   Appearance: She is well-developed. She is not ill-appearing or toxic-appearing.  ?Cardiovascular:  ?   Rate and Rhythm: Normal rate and regular rhythm.  ?   Pulses: Normal pulses.  ?   Heart sounds: Normal heart sounds, S1 normal and S2 normal.  ?Pulmonary:  ?   Effort: Pulmonary effort is normal.  ?   Breath sounds: Normal breath sounds.  ?Skin: ?   General: Skin is warm and dry.  ?Neurological:  ?   Mental Status: She is alert.  ?   GCS: GCS eye subscore is 4. GCS verbal subscore is 5. GCS motor subscore is 6.  ?Psychiatric:     ?   Speech: Speech normal.     ?   Behavior: Behavior normal. Behavior is cooperative.  ? ? ?Assessment and Plan:  ? ?Postpartum depression; Anxiety ?Stable; Room for improvement ?No red flags; Denies SI/HI ?Increase zoloft to 100 mg daily  ?Encouraged patient to continue participating in talk therapy  ?I advised patient that if they develop any SI, to tell someone immediately and seek medical attention ?Follow up in 6 months, sooner if concerns ? ?Elevated blood pressure reading ?Improved ?No need for medication intervention ?Continue to monitor BP regularly  ?Encouraged patient to work on healthy eating and regular exercise  ?Follow up if new/worsening symptoms or concerns occur ? ?Vitamin D deficiency ?Update labs, will make recommendations accordingly  ? ?Fatigue, unspecified type ?Update labs, will make recommendations accordingly  ? ?I,Havlyn C Ratchford,acting as a scribe for , PA.,have documented all relevant documentation on  the behalf of [employer], PA,as directed by  Jarold Motto, PA while in the presence of Jarold Motto, Jarold Motto. ? ?[place], PA, have reviewed all documentation for this visit. The documentation on 12/14/21 for the exam, diagnosis, procedures, and orders are all accurate and complete. ? ? ?12/16/21, PA-C ? ?

## 2021-12-15 ENCOUNTER — Other Ambulatory Visit: Payer: Self-pay | Admitting: Physician Assistant

## 2021-12-15 MED ORDER — VITAMIN D (ERGOCALCIFEROL) 1.25 MG (50000 UNIT) PO CAPS: 50000.0000 [IU] | ORAL_CAPSULE | ORAL | 0 refills | Status: DC

## 2021-12-29 ENCOUNTER — Encounter: Payer: Self-pay | Admitting: Physician Assistant

## 2021-12-29 LAB — GC/CHLAMYDIA PROBE AMP: CT/NG NAA Source: NOT DETECTED

## 2021-12-29 LAB — HSV 2 ANTIBODY, IGG: HSV 2 IGG,TYPE SPECIFIC AB: NEGATIVE

## 2021-12-29 LAB — RPR: RPR Screen: NONREACTIVE

## 2021-12-29 LAB — HSV 1 ANTIBODY, IGG: HSV 1 IgG, Type Spec: NEGATIVE

## 2022-01-04 ENCOUNTER — Encounter: Payer: Self-pay | Admitting: Physician Assistant

## 2022-01-27 ENCOUNTER — Other Ambulatory Visit (HOSPITAL_BASED_OUTPATIENT_CLINIC_OR_DEPARTMENT_OTHER): Payer: Self-pay

## 2022-01-30 ENCOUNTER — Ambulatory Visit (HOSPITAL_BASED_OUTPATIENT_CLINIC_OR_DEPARTMENT_OTHER)
Admit: 2022-01-30 | Discharge: 2022-01-30 | Disposition: A | Discharge status: Home / Self Care | Payer: No Typology Code available for payment source | Attending: Physician Assistant | Admitting: Physician Assistant

## 2022-01-30 ENCOUNTER — Ambulatory Visit
Admission: EM | Admit: 2022-01-30 | Discharge: 2022-01-30 | Disposition: A | Discharge status: Home / Self Care | Payer: No Typology Code available for payment source | Attending: Emergency Medicine | Admitting: Emergency Medicine

## 2022-01-30 DIAGNOSIS — S97122A Crushing injury of left lesser toe(s), initial encounter: Secondary | ICD-10-CM | POA: Diagnosis not present

## 2022-01-30 MED ORDER — IBUPROFEN 800 MG PO TABS: 800.0000 mg | ORAL_TABLET | Freq: Three times a day (TID) | ORAL | 0 refills | Status: DC | PRN

## 2022-01-30 MED ORDER — IBUPROFEN 800 MG PO TABS
800.0000 mg | ORAL_TABLET | Freq: Once | ORAL | Status: AC
  Administered 2022-01-30: 800 mg via ORAL

## 2022-01-30 NOTE — ED Provider Notes (Signed)
?[facility] ? ? ? ?CSN: [id number] ?Arrival date & time: 01/30/22  1352 ?  ? ?HISTORY  ? ?Chief Complaint  ?Patient presents with  ? Toe Injury  ? ?HPI ?Katherine Parrish is a 21 y.o. female. Patient presents to urgent care stating that she hit her left toenail/toe on a closet door and is now having pain and bruising in that area.  Patient states her toenail was like it might fall off and the tip of her toe has been red ever since.  Patient states he has been keeping her foot elevated and wearing soft soled shoes.  Patient reports limited range of motion after the injury of her left fifth toe.  Patient is able to ambulate independently at this time but reports pain when doing so. ? ?The history is provided by the patient.  ?Past Medical History:  ?Diagnosis Date  ? Asthma   ? Vaginal delivery 03/15/2021  ? ?Patient Active Problem List  ? Diagnosis Date Noted  ? Vitamin D deficiency 12/14/2021  ? Asthma 06/03/2021  ? Chronic hypertension with superimposed pre-eclampsia 03/14/2021  ? Thrombocytopenia (HCC) 03/14/2021  ? Mild intermittent asthma without complication 12/13/2017  ? ?Past Surgical History:  ?Procedure Laterality Date  ? HERNIA REPAIR    ? ?OB History   ? ? Gravida  ?1  ? Para  ?   ? Term  ?   ? Preterm  ?   ? AB  ?   ? Living  ?   ?  ? ? SAB  ?   ? IAB  ?   ? Ectopic  ?   ? Multiple  ?   ? Live Births  ?   ?   ?  ?  ? ?Home Medications   ? ?Prior to Admission medications   ?Medication Sig Start Date End Date Taking? Authorizing Provider  ?hydrochlorothiazide (MICROZIDE) 12.5 MG capsule Take 12.5 mg by mouth daily.   Yes [provider]  ?albuterol (VENTOLIN HFA) 108 (90 Base) MCG/ACT inhaler Inhale 1-2 puffs into the lungs every 6 (six) hours as needed for wheezing or shortness of breath. 08/04/21   Jarold Motto, PA  ?ibuprofen (ADVIL) 800 MG tablet Take 800 mg by mouth every 8 (eight) hours as needed. 03/17/21   [provider]  ?levonorgestrel (MIRENA) 20 MCG/DAY IUD 1 each  by Intrauterine route once.    [provider]  ?montelukast (SINGULAIR) 10 MG tablet Take 1 tablet (10 mg total) by mouth at bedtime. 10/04/21   Jarold Motto, PA  ?sertraline (ZOLOFT) 100 MG tablet Take 1 tablet (100 mg total) by mouth daily. 12/14/21   Jarold Motto, PA  ?Vitamin D, Ergocalciferol, (DRISDOL) 1.25 MG (50000 UNIT) CAPS capsule Take 1 capsule (50,000 Units total) by mouth every 7 (seven) days. 12/15/21   Jarold Motto, PA  ?Fluticasone Propionate, Inhal, 100 MCG/ACT AEPB Inhale 1 puff into the lungs in the morning and at bedtime. 10/05/21 10/06/21  Jarold Motto, PA  ? ? ?Family History ?Family History  ?Problem Relation Age of Onset  ? Uterine cancer Mother   ?     leiomyosarcoma  ? Breast cancer Maternal Grandmother   ? Colon cancer Neg Hx   ? ?Social History ?Social History  ? ?Tobacco Use  ? Smoking status: Never  ? Smokeless tobacco: Never  ?Vaping Use  ? Vaping Use: Never used  ?Substance Use Topics  ? Alcohol use: No  ? Drug use: No  ? ?Allergies   ?Aspirin ? ?  Review of Systems ?Review of Systems ?Pertinent findings noted in history of present illness.  ? ?Physical Exam ?Triage Vital Signs ?ED Triage Vitals  ?Enc Vitals Group  ?   BP 07/15/21 0827 (!) 147/82  ?   Pulse Rate 07/15/21 0827 72  ?   Resp 07/15/21 0827 18  ?   Temp 07/15/21 0827 98.3 ?F (36.8 ?C)  ?   Temp Source 07/15/21 0827 Oral  ?   SpO2 07/15/21 0827 98 %  ?   Weight --   ?   Height --   ?   Head Circumference --   ?   Peak Flow --   ?   Pain Score 07/15/21 0826 5  ?   Pain Loc --   ?   Pain Edu? --   ?   Excl. in GC? --   ?No data found. ? ?Updated Vital Signs ?BP 125/80 (BP Location: Right Arm)   Pulse 64   Temp 98.1 ?F (36.7 ?C) (Oral)   Resp 20   LMP 01/09/2022   SpO2 97%  ? ?Physical Exam ?Vitals and nursing note reviewed.  ?Constitutional:   ?   General: She is not in acute distress. ?   Appearance: Normal appearance. She is not ill-appearing.  ?HENT:  ?   Head: Normocephalic and atraumatic.  ?Eyes:   ?   General: Lids are normal.     ?   Right eye: No discharge.     ?   Left eye: No discharge.  ?   Extraocular Movements: Extraocular movements intact.  ?   Conjunctiva/sclera: Conjunctivae normal.  ?   Right eye: Right conjunctiva is not injected.  ?   Left eye: Left conjunctiva is not injected.  ?Neck:  ?   Trachea: Trachea and phonation normal.  ?Cardiovascular:  ?   Rate and Rhythm: Normal rate and regular rhythm.  ?   Pulses: Normal pulses.  ?   Heart sounds: Normal heart sounds. No murmur heard. ?  No friction rub. No gallop.  ?Pulmonary:  ?   Effort: Pulmonary effort is normal. No accessory muscle usage, prolonged expiration or respiratory distress.  ?   Breath sounds: Normal breath sounds. No stridor, decreased air movement or transmitted upper airway sounds. No decreased breath sounds, wheezing, rhonchi or rales.  ?Chest:  ?   Chest wall: No tenderness.  ?Musculoskeletal:     ?   General: Normal range of motion.  ?   Cervical back: Normal range of motion and neck supple. Normal range of motion.  ?   Comments: Left fifth toe is diffusely erythematous, slightly swollen, tender to palpation, unable to flex without pain.  ?Lymphadenopathy:  ?   Cervical: No cervical adenopathy.  ?Skin: ?   General: Skin is warm and dry.  ?   Findings: No erythema or rash.  ?Neurological:  ?   General: No focal deficit present.  ?   Mental Status: She is alert and oriented to person, place, and time.  ?Psychiatric:     ?   Mood and Affect: Mood normal.     ?   Behavior: Behavior normal.  ? ? ?Visual Acuity ?Right Eye Distance:   ?Left Eye Distance:   ?Bilateral Distance:   ? ?Right Eye Near:   ?Left Eye Near:    ?Bilateral Near:    ? ?UC Couse / Diagnostics / Procedures:  ?  ?EKG ? ?Radiology ?No results found. ? ?Procedures ?Procedures (including critical care time) ? ?  UC Diagnoses / Final Clinical Impressions(s)   ?I have reviewed the triage vital signs and the nursing notes. ? ?Pertinent labs & imaging results that were  available during my care of the patient were reviewed by me and considered in my medical decision making (see chart for details).   ? ?Final diagnoses:  ?Crushing injury of fifth toe of left foot, initial encounter  ? ?Patient to go to med center to have her x-ray done.  Patient provided with a prescription for ibuprofen for pain.  Patient placed in a postop shoe. ? ?ED Prescriptions   ? ? Medication Sig Dispense Auth. Provider  ? ibuprofen (ADVIL) 800 MG tablet Take 1 tablet (800 mg total) by mouth every 8 (eight) hours as needed for up to 21 doses for fever, headache, mild pain or moderate pain. 21 tablet Theadora RamaMorgan, Lielle Vandervort Scales, PA-C  ? ?  ? ?PDMP not reviewed this encounter. ? ?Pending results:  ?Labs Reviewed - No data to display ? ?Medications Ordered in UC: ?Medications  ?ibuprofen (ADVIL) tablet 800 mg (has no administration in time range)  ? ? ?Disposition Upon Discharge:  ?Condition: stable for discharge home ?Home: take medications as prescribed; routine discharge instructions as discussed; follow up as advised. ? ?Patient presented with an acute illness with associated systemic symptoms and significant discomfort requiring urgent management. In my opinion, this is a condition that a prudent lay person (someone who possesses an average knowledge of health and medicine) may potentially expect to result in complications if not addressed urgently such as respiratory distress, impairment of bodily function or dysfunction of bodily organs.  ? ?Routine symptom specific, illness specific and/or disease specific instructions were discussed with the patient and/or caregiver at length.  ? ?As such, the patient has been evaluated and assessed, work-up was performed and treatment was provided in alignment with urgent care protocols and evidence based medicine.  Patient/parent/caregiver has been advised that the patient may require follow up for further testing and treatment if the symptoms continue in spite of  treatment, as clinically indicated and appropriate. ? ?Patient/parent/caregiver [facility] been [facility] if no better; to PCP or the Emergency Department if new signs and symptoms develop, or if

## 2022-01-30 NOTE — Discharge Instructions (Addendum)
Please go to the main entrance at [place] [facility] to have x-ray performed of your left fifth toe.  We will contact you with results once we have received them which typically takes about an hour.  Please continue to wear the postop shoe as provided and take ibuprofen 800 mg every 8 hours as needed for pain.  Please keep your foot elevated and ice it as often as possible. ? ?I provided you with the contact information for [facility] which has an urgent orthopedic walk-in clinic just in case the results of your x-ray are concerning for extensive injury. ? ?Thank you for visiting urgent care today. ? ?

## 2022-01-30 NOTE — ED Triage Notes (Signed)
Pt states yesterday she hit her left toe nail on her closet door and is now having pain and bruising to the area.  ?

## 2022-02-04 ENCOUNTER — Encounter: Payer: Self-pay | Admitting: Physician Assistant

## 2022-02-06 ENCOUNTER — Other Ambulatory Visit (HOSPITAL_COMMUNITY): Payer: Self-pay

## 2022-02-06 MED ORDER — SERTRALINE HCL 50 MG PO TABS
50.0000 mg | ORAL_TABLET | Freq: Every day | ORAL | 1 refills | Status: DC
  Filled 2022-02-06: qty 90, 90d supply, fill #0

## 2022-02-06 NOTE — Telephone Encounter (Signed)
Please see message and advise if okay to go back on Zoloft 50 mg.

## 2022-02-16 ENCOUNTER — Other Ambulatory Visit (HOSPITAL_COMMUNITY): Payer: Self-pay

## 2022-03-20 ENCOUNTER — Ambulatory Visit: Payer: No Typology Code available for payment source | Admitting: Physician Assistant

## 2022-03-20 NOTE — Progress Notes (Incomplete)
Katherine Parrish is a 21 y.o. female here for a {New prob or follow up:31724}.  SCRIBE STATEMENT  History of Present Illness:   No chief complaint on file.   HPI Back Pain   Past Medical History:  Diagnosis Date   Asthma    Vaginal delivery 03/15/2021     Social History   Tobacco Use   Smoking status: Never   Smokeless tobacco: Never  Vaping Use   Vaping Use: Never used  Substance Use Topics   Alcohol use: No   Drug use: No    Past Surgical History:  Procedure Laterality Date   HERNIA REPAIR      Family History  Problem Relation Age of Onset   Uterine cancer Mother        leiomyosarcoma   Breast cancer Maternal Grandmother    Colon cancer Neg Hx     Allergies  Allergen Reactions   Aspirin Nausea Only    Current Medications:   Current Outpatient Medications:    albuterol (VENTOLIN HFA) 108 (90 Base) MCG/ACT inhaler, Inhale 1-2 puffs into the lungs every 6 (six) hours as needed for wheezing or shortness of breath., Disp: 18 g, Rfl: 1   hydrochlorothiazide (MICROZIDE) 12.5 MG capsule, Take 12.5 mg by mouth daily., Disp: , Rfl:    ibuprofen (ADVIL) 800 MG tablet, Take 1 tablet (800 mg total) by mouth every 8 (eight) hours as needed for up to 21 doses for fever, headache, mild pain or moderate pain., Disp: 21 tablet, Rfl: 0   levonorgestrel (MIRENA) 20 MCG/DAY IUD, 1 each by Intrauterine route once., Disp: , Rfl:    montelukast (SINGULAIR) 10 MG tablet, Take 1 tablet (10 mg total) by mouth at bedtime., Disp: 90 tablet, Rfl: 3   sertraline (ZOLOFT) 50 MG tablet, Take 1 tablet by mouth daily., Disp: 90 tablet, Rfl: 1   Vitamin D, Ergocalciferol, (DRISDOL) 1.25 MG (50000 UNIT) CAPS capsule, Take 1 capsule (50,000 Units total) by mouth every 7 (seven) days., Disp: 12 capsule, Rfl: 0   Review of Systems:   ROS Negative unless otherwise specified per HPI.   Vitals:   There were no vitals filed for this visit.   There is no height or weight on file to calculate  BMI.  Physical Exam:   Physical Exam  Assessment and Plan:   @DIAGLIST @    I,Katherine Parrish,acting as a scribe for , PA.,have documented all relevant documentation on the behalf of [employer], PA,as directed by  Jarold Motto, PA while in the presence of Jarold Motto, Jarold Motto.   ***  [name], PA-C

## 2022-03-24 ENCOUNTER — Encounter: Payer: Self-pay | Admitting: Physician Assistant

## 2022-03-31 ENCOUNTER — Other Ambulatory Visit (INDEPENDENT_AMBULATORY_CARE_PROVIDER_SITE_OTHER): Payer: No Typology Code available for payment source

## 2022-03-31 ENCOUNTER — Ambulatory Visit (INDEPENDENT_AMBULATORY_CARE_PROVIDER_SITE_OTHER): Payer: No Typology Code available for payment source | Admitting: Physician Assistant

## 2022-03-31 ENCOUNTER — Encounter: Payer: Self-pay | Admitting: Physician Assistant

## 2022-03-31 VITALS — BP 120/88 | Temp 98.2°F | Ht 67.0 in | Wt 211.2 lb

## 2022-03-31 DIAGNOSIS — D509 Iron deficiency anemia, unspecified: Secondary | ICD-10-CM

## 2022-03-31 DIAGNOSIS — Z803 Family history of malignant neoplasm of breast: Secondary | ICD-10-CM

## 2022-03-31 DIAGNOSIS — F53 Postpartum depression: Secondary | ICD-10-CM | POA: Diagnosis not present

## 2022-03-31 DIAGNOSIS — R300 Dysuria: Secondary | ICD-10-CM | POA: Diagnosis not present

## 2022-03-31 DIAGNOSIS — Z0000 Encounter for general adult medical examination without abnormal findings: Secondary | ICD-10-CM | POA: Diagnosis not present

## 2022-03-31 DIAGNOSIS — R5383 Other fatigue: Secondary | ICD-10-CM

## 2022-03-31 DIAGNOSIS — E559 Vitamin D deficiency, unspecified: Secondary | ICD-10-CM

## 2022-03-31 DIAGNOSIS — E669 Obesity, unspecified: Secondary | ICD-10-CM

## 2022-03-31 LAB — COMPREHENSIVE METABOLIC PANEL
ALT: 15 U/L (ref 0–35)
AST: 24 U/L (ref 0–37)
Albumin: 4.8 g/dL (ref 3.5–5.2)
Alkaline Phosphatase: 59 U/L (ref 39–117)
BUN: 13 mg/dL (ref 6–23)
CO2: 29 mEq/L (ref 19–32)
Calcium: 9.9 mg/dL (ref 8.4–10.5)
Chloride: 100 mEq/L (ref 96–112)
Creatinine, Ser: 0.7 mg/dL (ref 0.40–1.20)
GFR: 123.2 mL/min (ref >60.00)
Glucose, Bld: 97 mg/dL (ref 70–99)
Potassium: 4.1 mEq/L (ref 3.5–5.1)
Sodium: 140 mEq/L (ref 135–145)
Total Bilirubin: 0.4 mg/dL (ref 0.2–1.2)
Total Protein: 8.4 g/dL — ABNORMAL HIGH (ref 6.0–8.3)

## 2022-03-31 LAB — CBC WITH DIFFERENTIAL/PLATELET
Basophils Absolute: 0 10*3/uL (ref 0.0–0.1)
Basophils Relative: 0.6 % (ref 0.0–3.0)
Eosinophils Absolute: 0.3 10*3/uL (ref 0.0–0.7)
Eosinophils Relative: 5 % (ref 0.0–5.0)
HCT: 41.5 % (ref 36.0–46.0)
Hemoglobin: 13.4 g/dL (ref 12.0–15.0)
Lymphocytes Relative: 46.3 % — ABNORMAL HIGH (ref 12.0–46.0)
Lymphs Abs: 2.3 10*3/uL (ref 0.7–4.0)
MCHC: 32.3 g/dL (ref 30.0–36.0)
MCV: 87.5 fl (ref 78.0–100.0)
Monocytes Absolute: 0.4 10*3/uL (ref 0.1–1.0)
Monocytes Relative: 7.4 % (ref 3.0–12.0)
Neutro Abs: 2.1 10*3/uL (ref 1.4–7.7)
Neutrophils Relative %: 40.7 % — ABNORMAL LOW (ref 43.0–77.0)
Platelets: 132 10*3/uL — ABNORMAL LOW (ref 150.0–400.0)
RBC: 4.75 Mil/uL (ref 3.87–5.11)
RDW: 15.9 % — ABNORMAL HIGH (ref 11.5–15.5)
WBC: 5 10*3/uL (ref 4.0–10.5)

## 2022-03-31 LAB — POCT URINALYSIS DIPSTICK
Bilirubin, UA: NEGATIVE
Glucose, UA: NEGATIVE
Ketones, UA: NEGATIVE
Leukocytes, UA: NEGATIVE
Nitrite, UA: NEGATIVE
Protein, UA: NEGATIVE
Spec Grav, UA: 1.015 (ref 1.010–1.025)
Urobilinogen, UA: 0.2 E.U./dL
pH, UA: 7 (ref 5.0–8.0)

## 2022-03-31 LAB — LIPID PANEL
Cholesterol: 215 mg/dL — ABNORMAL HIGH (ref 0–200)
HDL: 57.1 mg/dL (ref >39.00)
LDL Cholesterol: 131 mg/dL — ABNORMAL HIGH (ref 0–99)
NonHDL: 157.71
Total CHOL/HDL Ratio: 4
Triglycerides: 132 mg/dL (ref 0.0–149.0)
VLDL: 26.4 mg/dL (ref 0.0–40.0)

## 2022-03-31 LAB — POCT URINE PREGNANCY: Preg Test, Ur: NEGATIVE

## 2022-03-31 LAB — TSH: TSH: 1.59 u[IU]/mL (ref 0.35–5.50)

## 2022-03-31 LAB — IBC + FERRITIN
Ferritin: 17.3 ng/mL (ref 10.0–291.0)
Iron: 77 ug/dL (ref 42–145)
Saturation Ratios: 15.3 % — ABNORMAL LOW (ref 20.0–50.0)
TIBC: 502.6 ug/dL — ABNORMAL HIGH (ref 250.0–450.0)
Transferrin: 359 mg/dL (ref 212.0–360.0)

## 2022-03-31 LAB — VITAMIN D 25 HYDROXY (VIT D DEFICIENCY, FRACTURES): VITD: 25.87 ng/mL — ABNORMAL LOW (ref 30.00–100.00)

## 2022-03-31 LAB — HEMOGLOBIN A1C: Hgb A1c MFr Bld: 5.3 % (ref 4.6–6.5)

## 2022-03-31 MED ORDER — MONTELUKAST SODIUM 10 MG PO TABS: 10.0000 mg | ORAL_TABLET | Freq: Every day | ORAL | 3 refills | Status: DC

## 2022-03-31 MED ORDER — CEPHALEXIN 500 MG PO CAPS: 500.0000 mg | ORAL_CAPSULE | Freq: Two times a day (BID) | ORAL | 0 refills | Status: DC

## 2022-03-31 MED ORDER — HYDROCHLOROTHIAZIDE 12.5 MG PO CAPS: 12.5000 mg | ORAL_CAPSULE | Freq: Every day | ORAL | 1 refills | Status: DC

## 2022-03-31 MED ORDER — SERTRALINE HCL 50 MG PO TABS: 50.0000 mg | ORAL_TABLET | Freq: Every day | ORAL | 1 refills | Status: DC

## 2022-03-31 NOTE — Progress Notes (Signed)
Subjective:    Katherine Parrish is a 21 y.o. female and is here for a comprehensive physical exam.  HPI  There are no preventive care reminders to display for this patient.  Acute Concerns: Dysuria -- painful urination x 3 days. Some slight RLQ pain - -this is consistent with past UTI episodes. Denies: vaginal discharge, fevers, chills, concern for pregnancy, n/v/d.  Chronic Issues: Vit D deficiency -- needs updated blood work and Music therapist on dosage PPD -- doing well, taking zoloft 50 mg daily. Denies SI/HI. Feels good on this dose. HTN -- Currently taking HCTZ 12.5 mg. At home blood pressure readings are: not checked. Patient denies chest pain, SOB, blurred vision, dizziness, unusual headaches, lower leg swelling. Patient is  compliant with medication. Denies excessive caffeine intake, stimulant usage, excessive alcohol intake, or increase in salt consumption.  BP Readings from Last 3 Encounters:  03/31/22 120/88  01/30/22 125/80  12/14/21 130/90   Anemia -- taking po iron every other day. Tolerating well but still feeling fatigued. She is wondering if levels are improved. Had two months of bleeding s/p IUD but is now not really having much bleeding.   Health Maintenance: Immunizations -- UTD PAP -- UTD Bone Density -- n/a Diet -- working on healthy eating as able Sleep habits -- no major concerns, kid sleeps in bed with her each night Exercise -- as able, nothing structured Current Weight -- Weight: 211 lb 3.2 oz (95.8 kg)  Weight History: Wt Readings from Last 10 Encounters:  03/31/22 211 lb 3.2 oz (95.8 kg)  12/14/21 203 lb 12.8 oz (92.4 kg)  08/23/21 207 lb (93.9 kg)  07/13/21 202 lb (91.6 kg)  06/03/21 207 lb (93.9 kg)  09/20/17 191 lb 5.8 oz (86.8 kg) (97 %, Z= 1.89)*   * Growth percentiles are based on CDC (Girls, 2-20 Years) data.   Body mass index is 33.08 kg/m. Mood -- stable  Patient's last menstrual period was 01/09/2022. Period characteristics -- no  major concerns Birth control -- IUD    reports no history of alcohol use.  Tobacco Use: Low Risk  (03/31/2022)   Patient History    Smoking Tobacco Use: Never    Smokeless Tobacco Use: Never    Passive Exposure: Not on file        03/31/2022   10:17 AM  Depression screen PHQ 2/9  Decreased Interest 0  Down, Depressed, Hopeless 0  PHQ - 2 Score 0     Other providers/specialists: Patient Care Team: Jarold Motto, [name] as PCP - General (Physician Assistant)   PMHx, SurgHx, SocialHx, Medications, and Allergies were reviewed in the Visit Navigator and updated as appropriate.   Past Medical History:  Diagnosis Date   Asthma    Vaginal delivery 03/15/2021     Past Surgical History:  Procedure Laterality Date   HERNIA REPAIR       Family History  Problem Relation Age of Onset   Uterine cancer Mother        leiomyosarcoma   Breast cancer Maternal Grandmother    Colon cancer Neg Hx     Social History   Tobacco Use   Smoking status: Never   Smokeless tobacco: Never  Vaping Use   Vaping Use: Never used  Substance Use Topics   Alcohol use: No   Drug use: No    Review of Systems:   Review of Systems  Constitutional:  Negative for chills, fever, malaise/fatigue and weight loss.  HENT:  Negative for  hearing loss, sinus pain and sore throat.   Respiratory:  Negative for cough and hemoptysis.   Cardiovascular:  Negative for chest pain, palpitations, leg swelling and PND.  Gastrointestinal:  Negative for abdominal pain, constipation, diarrhea, heartburn, nausea and vomiting.  Genitourinary:  Positive for dysuria. Negative for frequency and urgency.  Musculoskeletal:  Negative for back pain, myalgias and neck pain.  Skin:  Negative for itching and rash.  Neurological:  Negative for dizziness, tingling, seizures and headaches.  Endo/Heme/Allergies:  Negative for polydipsia.  Psychiatric/Behavioral:  Negative for depression. The patient is not nervous/anxious.      Objective:   BP 120/88   Temp 98.2 F (36.8 C) (Temporal)   Ht 5\' 7"  (1.702 m)   Wt 211 lb 3.2 oz (95.8 kg)   LMP 01/09/2022   BMI 33.08 kg/m   General Appearance:    Alert, cooperative, no distress, appears stated age  Head:    Normocephalic, without obvious abnormality, atraumatic  Eyes:    PERRL, conjunctiva/corneas clear, EOM's intact, fundi    benign, both eyes  Ears:    Normal TM's and external ear canals, both ears  Nose:   Nares normal, septum midline, mucosa normal, no drainage    or sinus tenderness  Throat:   Lips, mucosa, and tongue normal; teeth and gums normal  Neck:   Supple, symmetrical, trachea midline, no adenopathy;    thyroid:  no enlargement/tenderness/nodules; no carotid   bruit or JVD  Back:     Symmetric, no curvature, ROM normal, no CVA tenderness  Lungs:     Clear to auscultation bilaterally, respirations unlabored  Chest Wall:    No tenderness or deformity   Heart:    Regular rate and rhythm, S1 and S2 normal, no murmur, rub   or gallop  Breast Exam:    No tenderness, masses, or nipple abnormality  Abdomen:     Soft, non-tender, bowel sounds active all four quadrants,    no masses, no organomegaly  Genitalia:    Deferred  Rectal:    Deferred  Extremities:   Extremities normal, atraumatic, no cyanosis or edema  Pulses:   2+ and symmetric all extremities  Skin:   Skin color, texture, turgor normal, no rashes or lesions  Lymph nodes:   Cervical, supraclavicular, and axillary nodes normal  Neurologic:   CNII-XII intact, normal strength, sensation and reflexes    throughout   Results for orders placed or performed in visit on 03/31/22  POCT Urinalysis Dipstick  Result Value Ref Range   Color, UA yellow    Clarity, UA clear    Glucose, UA Negative Negative   Bilirubin, UA negative    Ketones, UA negative    Spec Grav, UA 1.015 1.010 - 1.025   Blood, UA trace    pH, UA 7.0 5.0 - 8.0   Protein, UA Negative Negative   Urobilinogen, UA 0.2 0.2  or 1.0 E.U./dL   Nitrite, UA negative    Leukocytes, UA Negative Negative   Appearance     Odor    POCT urine pregnancy  Result Value Ref Range   Preg Test, Ur Negative Negative   =  Assessment/Plan:   Routine physical examination Today patient counseled on age appropriate routine health concerns for screening and prevention, each reviewed and up to date or declined. Immunizations reviewed and up to date or declined. Labs ordered and reviewed. Risk factors for depression reviewed and negative. Hearing function and visual acuity are intact. ADLs screened and  addressed as needed. Functional ability and level of safety reviewed and appropriate. Education, counseling and referrals performed based on assessed risks today. Patient provided with a copy of personalized plan for preventive services.  Dysuria UA negative but symptoms are consistent with past infections Start empiric keflex 500 mg BID x 7 days Urine culture pending -- will adjust treatment based on results  Postpartum depression Controlled Continue zoloft 50 mg daily Follow-up in 1 year, sooner if concerns  Obesity, unspecified classification, unspecified obesity type, unspecified whether serious comorbidity present Consider Wegovy if all labs WNL; no fam hx of thyroid cancer or personal hx of pancreatitis  Iron deficiency anemia, unspecified iron deficiency anemia type Update iron panel and provide recommendations accordingly  Vitamin D deficiency Update Vit D and provide recommendations accordingly    Patient Counseling:   [x]     Nutrition: Stressed importance of moderation in sodium/caffeine intake, saturated fat and cholesterol, caloric balance, sufficient intake of fresh fruits, vegetables, fiber, calcium, iron, and 1 mg of folate supplement per day (for females capable of pregnancy).   [x]      Stressed the importance of regular exercise.    [x]     Substance Abuse: Discussed cessation/primary prevention of  tobacco, alcohol, or other drug use; driving or other dangerous activities under the influence; availability of treatment for abuse.    [x]      Injury prevention: Discussed safety belts, safety helmets, smoke detector, smoking near bedding or upholstery.    [x]      Sexuality: Discussed sexually transmitted diseases, partner selection, use of condoms, avoidance of unintended pregnancy  and contraceptive alternatives.    [x]     Dental health: Discussed importance of regular tooth brushing, flossing, and dental visits.   [x]      Health maintenance and immunizations reviewed. Please refer to Health maintenance section.   , PA-C [facility]

## 2022-03-31 NOTE — Addendum Note (Signed)
Addended by: Haynes Bast on: 03/31/2022 04:49 PM   Modules accepted: Orders

## 2022-03-31 NOTE — Patient Instructions (Signed)
It was great to see you!  Genetics counselor referral for cancer history Update blood work today Start Keflex 500 mg BID x 7 days -- I'll be in touch with your urine culture  Take care,  Jarold Motto PA-C

## 2022-04-02 LAB — URINE CULTURE
MICRO NUMBER:: 13648463
SPECIMEN QUALITY:: ADEQUATE

## 2022-04-03 ENCOUNTER — Encounter: Payer: Self-pay | Admitting: Physician Assistant

## 2022-04-03 ENCOUNTER — Other Ambulatory Visit: Payer: Self-pay | Admitting: Physician Assistant

## 2022-04-03 DIAGNOSIS — D696 Thrombocytopenia, unspecified: Secondary | ICD-10-CM

## 2022-04-04 ENCOUNTER — Telehealth: Payer: Self-pay | Admitting: Licensed Clinical Social Worker

## 2022-04-04 ENCOUNTER — Telehealth: Payer: Self-pay | Admitting: Physician Assistant

## 2022-04-04 NOTE — Telephone Encounter (Signed)
Scheduled appt per 7/14 referral. Pt is aware of appt date and time. Pt is aware to arrive 15 mins prior to appt time and to bring and updated insurance card. Pt is aware of appt location.   

## 2022-04-04 NOTE — Telephone Encounter (Signed)
Scheduled appt per 7/17 referral. Pt is aware of appt date and time. Pt is aware to arrive 15 mins prior to appt time and to bring and updated insurance card. Pt is aware of appt location.   

## 2022-04-20 ENCOUNTER — Encounter: Payer: Self-pay | Admitting: Physician Assistant

## 2022-04-21 ENCOUNTER — Other Ambulatory Visit (HOSPITAL_COMMUNITY): Payer: Self-pay

## 2022-04-21 MED ORDER — WEGOVY 0.25 MG/0.5ML ~~LOC~~ SOAJ
0.2500 mg | SUBCUTANEOUS | 0 refills | Status: DC
  Filled 2022-04-21: qty 2, 28d supply, fill #0

## 2022-04-26 ENCOUNTER — Other Ambulatory Visit (HOSPITAL_COMMUNITY): Payer: Self-pay

## 2022-04-26 ENCOUNTER — Telehealth: Payer: Self-pay | Admitting: *Deleted

## 2022-04-26 NOTE — Telephone Encounter (Signed)
Received fax from pharmacy PA needed for Wegovy 0.25 mg . [employer] form completed and faxed. Awaiting determination.

## 2022-04-27 ENCOUNTER — Encounter: Payer: Self-pay | Admitting: Physician Assistant

## 2022-04-27 NOTE — Progress Notes (Deleted)
[facility] [phone]   [phone]  INITIAL CONSULT NOTE  Patient Care Team: Jarold Motto, [name] as PCP - General (Physician Assistant)  CHIEF COMPLAINTS/PURPOSE OF CONSULTATION:  Thrombocytopenia  HISTORY OF PRESENTING ILLNESS:  Katherine Parrish 21 y.o. female with medical history significant for ***  On review of the previous records ***  On exam today ***  MEDICAL HISTORY:  Past Medical History:  Diagnosis Date   Asthma    Vaginal delivery 03/15/2021    SURGICAL HISTORY: Past Surgical History:  Procedure Laterality Date   HERNIA REPAIR      SOCIAL HISTORY: Social History   Socioeconomic History   Marital status: Single    Spouse name: Not on file   Number of children: Not on file   Years of education: Not on file   Highest education level: Not on file  Occupational History   Not on file  Tobacco Use   Smoking status: Never   Smokeless tobacco: Never  Vaping Use   Vaping Use: Never used  Substance and Sexual Activity   Alcohol use: No   Drug use: No   Sexual activity: Not Currently    Birth control/protection: None  Other Topics Concern   Not on file  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  MEDICATIONS:  Current Outpatient Medications  Medication Sig Dispense Refill   albuterol (VENTOLIN HFA) 108 (90 Base) MCG/ACT inhaler Inhale 1-2 puffs into the lungs every 6 (six) hours as needed for  wheezing or shortness of breath. 18 g 1   cephALEXin (KEFLEX) 500 MG capsule Take 1 capsule (500 mg total) by mouth 2 (two) times daily. 14 capsule 0   hydrochlorothiazide (MICROZIDE) 12.5 MG capsule Take 1 capsule (12.5 mg total) by mouth daily. 90 capsule 1   ibuprofen (ADVIL) 800 MG tablet Take 1 tablet (800 mg total) by mouth every 8 (eight) hours as needed for up to 21 doses for fever, headache, mild pain or moderate pain. 21 tablet 0   levonorgestrel (MIRENA) 20 MCG/DAY IUD 1 each by Intrauterine route once.     montelukast (SINGULAIR) 10 MG tablet Take 1 tablet (10 mg total) by mouth at bedtime. 90 tablet 3   Semaglutide-Weight Management (WEGOVY) 0.25 MG/0.5ML SOAJ Inject 0.25 mg into the skin once a week. 2 mL 0   sertraline (ZOLOFT) 50 MG tablet Take 1 tablet by mouth daily. 90 tablet 1   Vitamin D, Ergocalciferol, (DRISDOL) 1.25 MG (50000 UNIT) CAPS capsule Take 1 capsule (50,000 Units total) by mouth every 7 (seven) days. 12 capsule 0   No current facility-administered medications for this visit.    REVIEW OF SYSTEMS:   Constitutional: ( - ) fevers, ( - )  chills , ( - ) night sweats Eyes: ( - ) blurriness of vision, ( - ) double vision, ( - )  watery eyes Ears, nose, mouth, throat, and face: ( - ) mucositis, ( - ) sore throat Respiratory: ( - ) cough, ( - ) dyspnea, ( - ) wheezes Cardiovascular: ( - ) palpitation, ( - ) chest discomfort, ( - ) lower extremity swelling Gastrointestinal:  ( - ) nausea, ( - ) heartburn, ( - ) change in bowel habits Skin: ( - ) abnormal skin rashes Lymphatics: ( - ) new lymphadenopathy, ( - ) easy bruising Neurological: ( - ) numbness, ( - ) tingling, ( - ) new weaknesses Behavioral/Psych: ( - ) mood change, ( - ) new changes  All other systems were reviewed with the patient and are negative.  PHYSICAL EXAMINATION: ECOG PERFORMANCE STATUS: {CHL ONC ECOG [phone]}  There were no vitals filed for this visit. There were no vitals filed for  this visit.  GENERAL: well appearing *** in NAD  SKIN: skin color, texture, turgor are normal, no rashes or significant lesions EYES: conjunctiva are pink and non-injected, sclera clear OROPHARYNX: no exudate, no erythema; lips, buccal mucosa, and tongue normal  NECK: supple, non-tender LYMPH:  no palpable lymphadenopathy in the cervical, axillary or supraclavicular lymph nodes.  LUNGS: clear to auscultation and percussion with normal breathing effort HEART: regular rate & rhythm and no murmurs and no lower extremity edema ABDOMEN: soft, non-tender, non-distended, normal bowel sounds Musculoskeletal: no cyanosis of digits and no clubbing  PSYCH: alert & oriented x 3, fluent speech NEURO: no focal motor/sensory deficits  LABORATORY DATA:  I have reviewed the data as listed    Latest Ref Rng & Units 03/31/2022   10:47 AM 12/14/2021   11:01 AM 10/13/2021   12:00 AM  CBC  WBC 4.0 - 10.5 K/uL 5.0  3.9  5.6      Hemoglobin 12.0 - 15.0 g/dL 35.0  09.3  81.8      Hematocrit 36.0 - 46.0 % 41.5  34.6  38      Platelets 150.0 - 400.0 K/uL 132.0  148.0  182         This result is from an external source.       Latest Ref Rng & Units 03/31/2022   10:47 AM 12/14/2021   11:01 AM 06/08/2021    3:52 PM  CMP  Glucose 70 - 99 mg/dL 97  84  299   BUN 6 - 23 mg/dL 13  19  9    Creatinine 0.40 - 1.20 mg/dL  3.71  6.96   Sodium 135 - 145 mEq/L 140  139  138   Potassium 3.5 - 5.1 mEq/L 4.1  3.8  3.5   Chloride 96 - 112 mEq/L 100  105  103   CO2 19 - 32 mEq/L 29  26  26    Calcium 8.4 - 10.5 mg/dL 9.9  9.3  9.5   Total Protein 6.0 - 8.3 g/dL 8.4  7.3  7.8   Total Bilirubin 0.2 - 1.2 mg/dL 0.4  0.4  0.2   Alkaline Phos 39 - 117 U/L 59  60  71   AST 0 - 37 U/L 24  19  26    ALT 0 - 35 U/L 15  11  18       PATHOLOGY: ***  BLOOD FILM: *** Review of the peripheral blood smear showed normal appearing white cells with neutrophils that were appropriately lobated and granulated. There was no  predominance of bi-lobed or hyper-segmented neutrophils appreciated. No Dohle bodies were noted. There was no left shifting,  immature forms or blasts noted. Lymphocytes remain normal in size without any predominance of large granular lymphocytes. Red cells show no anisopoikilocytosis, macrocytes , microcytes or polychromasia. There were no schistocytes, target cells, echinocytes, acanthocytes, dacrocytes, or stomatocytes.There was no rouleaux formation, nucleated red cells, or intra-cellular inclusions noted. The platelets are normal in size, shape, and color without any clumping evident.  RADIOGRAPHIC STUDIES: I have personally reviewed the radiological images as listed and agreed with the findings in the report. No results found.  ASSESSMENT & PLAN ***   I reviewed potential etiologies for thrombocytopenia including liver disease, splenomegaly, infectious processes, nutritional anemias, immune mediated and bone marrow disorders. Patient is not taking any medications or recent infections. Patient will proceed with labs today to check CBC, CMP, Vitamin B12, MMA, folate, Hepatitis B and C serologies, HIV serology,and save smear.   #Thrombocytopenia, etiology unknown: --Labs today to check CBC w/diff, CMP, B12 level, folate level, Hep B and C serologies, HIV serology. --Evaluate for platelet abnormality with save smear. --If above workup is negative, evaluate for liver disease and/or splenomegaly with abdominal US --RTC based on above workup.    No orders of the defined types were placed in this encounter.   All questions were answered. The patient knows to call the clinic with any problems, questions or concerns.  I have spent a total of {CHL ONC TIME VISIT - [id number]} minutes of face-to-face and non-face-to-face time, preparing to see the patient, obtaining and/or reviewing separately obtained history, performing a medically appropriate examination, counseling and educating the  patient, ordering medications/tests/procedures, referring and communicating with other health care professionals, documenting clinical information in the electronic health record, independently interpreting results and communicating results to the patient, and care coordination.   Georga Kaufmann, PA-C Department of Hematology/Oncology [facility] Phone: [phone]

## 2022-04-28 ENCOUNTER — Inpatient Hospital Stay: Payer: No Typology Code available for payment source

## 2022-04-28 ENCOUNTER — Inpatient Hospital Stay: Payer: No Typology Code available for payment source | Attending: Physician Assistant | Admitting: Physician Assistant

## 2022-05-04 ENCOUNTER — Other Ambulatory Visit (HOSPITAL_COMMUNITY): Payer: Self-pay

## 2022-05-04 NOTE — Telephone Encounter (Signed)
Received fax from [employer] Wegovy 0.25 mg has been approved 05/02/2022 through 11/21/2022.  Called [facility] pharmacy spoke to Daphanie, told her PA for [facility] 0.25 mg has been approved. Daphanie verbalized understanding. Asked her if they have any in stock? She said yes. Told her okay I will let the pt know.

## 2022-05-04 NOTE — Telephone Encounter (Signed)
Left message on voicemail to call office.  

## 2022-05-11 ENCOUNTER — Telehealth: Payer: Self-pay | Admitting: Physician Assistant

## 2022-05-11 NOTE — Telephone Encounter (Signed)
R/s pt's new hem appt as well as genetics appt per pt request. Pt is aware of new appts dates/times.

## 2022-05-15 ENCOUNTER — Encounter: Payer: Self-pay | Admitting: Emergency Medicine

## 2022-05-15 ENCOUNTER — Emergency Department (HOSPITAL_BASED_OUTPATIENT_CLINIC_OR_DEPARTMENT_OTHER)
Admission: EM | Admit: 2022-05-15 | Discharge: 2022-05-15 | Disposition: A | Discharge status: Home / Self Care | Payer: No Typology Code available for payment source | Attending: Emergency Medicine | Admitting: Emergency Medicine

## 2022-05-15 ENCOUNTER — Encounter (HOSPITAL_BASED_OUTPATIENT_CLINIC_OR_DEPARTMENT_OTHER): Payer: Self-pay

## 2022-05-15 ENCOUNTER — Other Ambulatory Visit: Payer: Self-pay

## 2022-05-15 ENCOUNTER — Ambulatory Visit
Admission: EM | Admit: 2022-05-15 | Discharge: 2022-05-15 | Disposition: A | Discharge status: Home / Self Care | Payer: No Typology Code available for payment source | Attending: Urgent Care | Admitting: Urgent Care

## 2022-05-15 DIAGNOSIS — R42 Dizziness and giddiness: Secondary | ICD-10-CM | POA: Diagnosis not present

## 2022-05-15 DIAGNOSIS — I10 Essential (primary) hypertension: Secondary | ICD-10-CM | POA: Insufficient documentation

## 2022-05-15 DIAGNOSIS — R5383 Other fatigue: Secondary | ICD-10-CM | POA: Insufficient documentation

## 2022-05-15 DIAGNOSIS — G44209 Tension-type headache, unspecified, not intractable: Secondary | ICD-10-CM

## 2022-05-15 DIAGNOSIS — R519 Headache, unspecified: Secondary | ICD-10-CM | POA: Insufficient documentation

## 2022-05-15 DIAGNOSIS — D696 Thrombocytopenia, unspecified: Secondary | ICD-10-CM | POA: Insufficient documentation

## 2022-05-15 DIAGNOSIS — R110 Nausea: Secondary | ICD-10-CM | POA: Diagnosis not present

## 2022-05-15 DIAGNOSIS — H53149 Visual discomfort, unspecified: Secondary | ICD-10-CM

## 2022-05-15 DIAGNOSIS — J45909 Unspecified asthma, uncomplicated: Secondary | ICD-10-CM | POA: Diagnosis not present

## 2022-05-15 DIAGNOSIS — Z20822 Contact with and (suspected) exposure to covid-19: Secondary | ICD-10-CM | POA: Diagnosis not present

## 2022-05-15 LAB — BASIC METABOLIC PANEL
Anion gap: 6 (ref 5–15)
BUN: 13 mg/dL (ref 6–20)
CO2: 26 mmol/L (ref 22–32)
Calcium: 9.3 mg/dL (ref 8.9–10.3)
Chloride: 105 mmol/L (ref 98–111)
Creatinine, Ser: 0.64 mg/dL (ref 0.44–1.00)
GFR, Estimated: >60 mL/min (ref >60)
Glucose, Bld: 88 mg/dL (ref 70–99)
Potassium: 4.2 mmol/L (ref 3.5–5.1)
Sodium: 137 mmol/L (ref 135–145)

## 2022-05-15 LAB — SARS CORONAVIRUS 2 BY RT PCR: SARS Coronavirus 2 by RT PCR: NEGATIVE

## 2022-05-15 LAB — CBC WITH DIFFERENTIAL/PLATELET
Abs Immature Granulocytes: 0.01 10*3/uL (ref 0.00–0.07)
Basophils Absolute: 0 10*3/uL (ref 0.0–0.1)
Basophils Relative: 1 %
Eosinophils Absolute: 0.2 10*3/uL (ref 0.0–0.5)
Eosinophils Relative: 3 %
HCT: 38.5 % (ref 36.0–46.0)
Hemoglobin: 12.6 g/dL (ref 12.0–15.0)
Immature Granulocytes: 0 %
Lymphocytes Relative: 42 %
Lymphs Abs: 2.6 10*3/uL (ref 0.7–4.0)
MCH: 29 pg (ref 26.0–34.0)
MCHC: 32.7 g/dL (ref 30.0–36.0)
MCV: 88.5 fL (ref 80.0–100.0)
Monocytes Absolute: 0.6 10*3/uL (ref 0.1–1.0)
Monocytes Relative: 9 %
Neutro Abs: 2.7 10*3/uL (ref 1.7–7.7)
Neutrophils Relative %: 45 %
Platelets: 126 10*3/uL — ABNORMAL LOW (ref 150–400)
RBC: 4.35 MIL/uL (ref 3.87–5.11)
RDW: 14.9 % (ref 11.5–15.5)
WBC: 6.1 10*3/uL (ref 4.0–10.5)
nRBC: 0 % (ref 0.0–0.2)

## 2022-05-15 LAB — URINALYSIS, ROUTINE W REFLEX MICROSCOPIC

## 2022-05-15 LAB — URINALYSIS, MICROSCOPIC (REFLEX): RBC / HPF: >50 RBC/hpf (ref 0–5)

## 2022-05-15 LAB — PREGNANCY, URINE: Preg Test, Ur: NEGATIVE

## 2022-05-15 MED ORDER — PROCHLORPERAZINE EDISYLATE 10 MG/2ML IJ SOLN
10.0000 mg | Freq: Once | INTRAMUSCULAR | Status: AC
  Administered 2022-05-15: 10 mg via INTRAVENOUS
  Filled 2022-05-15: qty 2

## 2022-05-15 MED ORDER — IBUPROFEN 800 MG PO TABS
800.0000 mg | ORAL_TABLET | Freq: Once | ORAL | Status: AC
Start: 2022-05-15 — End: 2022-05-15
  Administered 2022-05-15: 800 mg via ORAL

## 2022-05-15 MED ORDER — NAPROXEN 500 MG PO TABS: 500.0000 mg | ORAL_TABLET | Freq: Two times a day (BID) | ORAL | 0 refills | Status: DC

## 2022-05-15 MED ORDER — SODIUM CHLORIDE 0.9 % IV BOLUS
1000.0000 mL | Freq: Once | INTRAVENOUS | Status: AC
  Administered 2022-05-15: 1000 mL via INTRAVENOUS

## 2022-05-15 MED ORDER — ONDANSETRON 4 MG PO TBDP
4.0000 mg | ORAL_TABLET | Freq: Once | ORAL | Status: AC
  Administered 2022-05-15: 4 mg via ORAL

## 2022-05-15 MED ORDER — DIPHENHYDRAMINE HCL 50 MG/ML IJ SOLN
50.0000 mg | Freq: Once | INTRAMUSCULAR | Status: AC
  Administered 2022-05-15: 50 mg via INTRAVENOUS
  Filled 2022-05-15: qty 1

## 2022-05-15 MED ORDER — ONDANSETRON 8 MG PO TBDP: 8.0000 mg | ORAL_TABLET | Freq: Three times a day (TID) | ORAL | 0 refills | Status: DC | PRN

## 2022-05-15 NOTE — Discharge Instructions (Signed)
Your history, exam, evaluation were overall reassuring.  Your labs showed similar thrombocytopenia which you need to continue outpatient work-up with hematology.  With the resolution of the headache with medications, we feel you are safe for discharge home.  Please rest and stay hydrated.  If any symptoms change or worsen acutely, please return to the nearest emergency department.

## 2022-05-15 NOTE — ED Provider Notes (Signed)
[facility]   MRN: [id number] DOB: 2001/01/25  Subjective:   Katherine Parrish is a 21 y.o. female presenting for persistent frontal headache with intermittent dizziness and nausea without vomiting for the past 3 days.  Patient had her IUD removed on Thursday and symptoms started thereafter.  She has had significant difficulties with her IUD for months and finally had it removed.  Reports that her vaginal bleeding has improved since then.  Denies history of migraines.  No fever, neck pain, neck stiffness, ear pain, tinnitus, sore throat, cough, chest pain, shortness of breath or wheezing.  Patient has a history of hypertension, preeclampsia.  However, following giving birth her blood pressure normalized and has not been taking medicine for this.  No current facility-administered medications for this encounter.  Current Outpatient Medications:    albuterol (VENTOLIN HFA) 108 (90 Base) MCG/ACT inhaler, Inhale 1-2 puffs into the lungs every 6 (six) hours as needed for wheezing or shortness of breath., Disp: 18 g, Rfl: 1   cephALEXin (KEFLEX) 500 MG capsule, Take 1 capsule (500 mg total) by mouth 2 (two) times daily., Disp: 14 capsule, Rfl: 0   hydrochlorothiazide (MICROZIDE) 12.5 MG capsule, Take 1 capsule (12.5 mg total) by mouth daily., Disp: 90 capsule, Rfl: 1   ibuprofen (ADVIL) 800 MG tablet, Take 1 tablet (800 mg total) by mouth every 8 (eight) hours as needed for up to 21 doses for fever, headache, mild pain or moderate pain., Disp: 21 tablet, Rfl: 0   levonorgestrel (MIRENA) 20 MCG/DAY IUD, 1 each by Intrauterine route once., Disp: , Rfl:    montelukast (SINGULAIR) 10 MG tablet, Take 1 tablet (10 mg total) by mouth at bedtime., Disp: 90 tablet, Rfl: 3   Semaglutide-Weight Management (WEGOVY) 0.25 MG/0.5ML SOAJ, Inject 0.25 mg into the skin once a week., Disp: 2 mL, Rfl: 0   sertraline (ZOLOFT) 50 MG tablet, Take 1 tablet by mouth daily., Disp: 90 tablet, Rfl: 1   Vitamin  D, Ergocalciferol, (DRISDOL) 1.25 MG (50000 UNIT) CAPS capsule, Take 1 capsule (50,000 Units total) by mouth every 7 (seven) days., Disp: 12 capsule, Rfl: 0   Allergies  Allergen Reactions   Aspirin Nausea Only    Past Medical History:  Diagnosis Date   Asthma    Vaginal delivery 03/15/2021     Past Surgical History:  Procedure Laterality Date   HERNIA REPAIR      Family History  Problem Relation Age of Onset   Uterine cancer Mother        leiomyosarcoma   Breast cancer Maternal Grandmother    Colon cancer Neg Hx     Social History   Tobacco Use   Smoking status: Never   Smokeless tobacco: Never  Vaping Use   Vaping Use: Never used  Substance Use Topics   Alcohol use: No   Drug use: No    ROS   Objective:   Vitals: BP (!) 146/98   Pulse 68   Temp 98.4 F (36.9 C)   Resp 18   SpO2 98%   BP Readings from Last 3 Encounters:  05/15/22 (!) 146/98  03/31/22 120/88  01/30/22 125/80   Physical Exam Constitutional:      General: She is not in acute distress.    Appearance: Normal appearance. She is well-developed and normal weight. She is not ill-appearing, toxic-appearing or diaphoretic.  HENT:     Head: Normocephalic and atraumatic.     Right Ear: Tympanic membrane, ear canal and  external ear normal. No drainage or tenderness. No middle ear effusion. There is no impacted cerumen. Tympanic membrane is not erythematous.     Left Ear: Tympanic membrane, ear canal and external ear normal. No drainage or tenderness.  No middle ear effusion. There is no impacted cerumen. Tympanic membrane is not erythematous.     Nose: Nose normal. No congestion or rhinorrhea.     Mouth/Throat:     Mouth: Mucous membranes are moist. No oral lesions.     Pharynx: No pharyngeal swelling, oropharyngeal exudate, posterior oropharyngeal erythema or uvula swelling.     Tonsils: No tonsillar exudate or tonsillar abscesses.  Eyes:     General: No scleral icterus.       Right eye: No  discharge.        Left eye: No discharge.     Extraocular Movements: Extraocular movements intact.     Right eye: Normal extraocular motion.     Left eye: Normal extraocular motion.     Conjunctiva/sclera: Conjunctivae normal.     Pupils: Pupils are equal, round, and reactive to light.  Neck:     Meningeal: Brudzinski's sign and Kernig's sign absent.  Cardiovascular:     Rate and Rhythm: Normal rate.  Pulmonary:     Effort: Pulmonary effort is normal.  Musculoskeletal:     Cervical back: Normal range of motion and neck supple. No rigidity.  Lymphadenopathy:     Cervical: No cervical adenopathy.  Skin:    General: Skin is warm and dry.  Neurological:     General: No focal deficit present.     Mental Status: She is alert and oriented to person, place, and time.     Cranial Nerves: No cranial nerve deficit, dysarthria or facial asymmetry.     Motor: No weakness or pronator drift.     Coordination: Romberg sign negative. Coordination normal. Finger-Nose-Finger Test and Heel to [facility] Test normal. Rapid alternating movements normal.     Gait: Gait and tandem walk normal.     Deep Tendon Reflexes: Reflexes normal.  Psychiatric:        Mood and Affect: Mood normal.        Speech: Speech normal.        Behavior: Behavior normal.        Thought Content: Thought content normal.        Judgment: Judgment normal.     Assessment and Plan :   PDMP not reviewed this encounter.  1. Tension headache   2. Generalized headache   3. Dizziness   4. Nausea without vomiting   5. Photophobia    Patient declined COVID testing.  Recommended managing for tension type headache.  She was given ibuprofen and Zofran in clinic.  Recommended general supportive care including starting to hydrate much better as an outpatient.  Follow-up with her OB/GYN for complete lab review of the labs she had done on Thursday. Counseled patient on potential for adverse effects with medications prescribed/recommended  today, ER and return-to-clinic precautions discussed, patient verbalized understanding.    Wallis Bamberg, [place] 05/15/22 1221

## 2022-05-15 NOTE — ED Provider Notes (Signed)
[facility] Provider Note   CSN: [id number] Arrival date & time: 05/15/22  1758     History  Chief Complaint  Patient presents with   Headache    Katherine Parrish is a 21 y.o. female.  The history is provided by the patient and medical records. No language interpreter was used.  Headache Pain location:  Generalized Quality:  Dull Radiates to:  Does not radiate Severity currently:  8/10 Severity at highest:  8/10 Onset quality:  Gradual Duration:  2 days Timing:  Constant Progression:  Waxing and waning Chronicity:  Recurrent Similar to prior headaches: yes   Context: bright light and loud noise   Relieved by:  Nothing Worsened by:  Nothing Ineffective treatments:  None tried Associated symptoms: photophobia   Associated symptoms: no abdominal pain, no back pain, no blurred vision, no congestion, no cough, no diarrhea, no dizziness, no eye pain, no facial pain, no fatigue, no fever, no loss of balance, no nausea, no near-syncope, no neck pain, no neck stiffness, no numbness, no paresthesias, no seizures, no visual change, no vomiting and no weakness        Home Medications Prior to Admission medications   Medication Sig Start Date End Date Taking? Authorizing Provider  sertraline (ZOLOFT) 50 MG tablet Take 1 tablet by mouth daily. 03/31/22  Yes Inda Coke, PA  albuterol (VENTOLIN HFA) 108 (90 Base) MCG/ACT inhaler Inhale 1-2 puffs into the lungs every 6 (six) hours as needed for wheezing or shortness of breath. 08/04/21   Inda Coke, PA  cephALEXin (KEFLEX) 500 MG capsule Take 1 capsule (500 mg total) by mouth 2 (two) times daily. 03/31/22   Inda Coke, PA  hydrochlorothiazide (MICROZIDE) 12.5 MG capsule Take 1 capsule (12.5 mg total) by mouth daily. 03/31/22   Inda Coke, PA  ibuprofen (ADVIL) 800 MG tablet Take 1 tablet (800 mg total) by mouth every 8 (eight) hours as needed for up to 21 doses for fever, headache, mild  pain or moderate pain. 01/30/22   Lynden Oxford Scales, PA-C  levonorgestrel (MIRENA) 20 MCG/DAY IUD 1 each by Intrauterine route once.    [provider]  montelukast (SINGULAIR) 10 MG tablet Take 1 tablet (10 mg total) by mouth at bedtime. 03/31/22   Inda Coke, PA  naproxen (NAPROSYN) 500 MG tablet Take 1 tablet (500 mg total) by mouth 2 (two) times daily with a meal. 05/15/22   Jaynee Eagles, PA-C  ondansetron (ZOFRAN-ODT) 8 MG disintegrating tablet Take 1 tablet (8 mg total) by mouth every 8 (eight) hours as needed for nausea or vomiting. 05/15/22   Jaynee Eagles, PA-C  Semaglutide-Weight Management (WEGOVY) 0.25 MG/0.5ML SOAJ Inject 0.25 mg into the skin once a week. 04/21/22   Inda Coke, PA  Vitamin D, Ergocalciferol, (DRISDOL) 1.25 MG (50000 UNIT) CAPS capsule Take 1 capsule (50,000 Units total) by mouth every 7 (seven) days. 12/15/21   Inda Coke, PA  Fluticasone Propionate, Inhal, 100 MCG/ACT AEPB Inhale 1 puff into the lungs in the morning and at bedtime. 10/05/21 10/06/21  Inda Coke, PA      Allergies    Aspirin    Review of Systems   Review of Systems  Constitutional:  Negative for chills, fatigue and fever.  HENT:  Negative for congestion.   Eyes:  Positive for photophobia. Negative for blurred vision and pain.  Respiratory:  Negative for cough, chest tightness, shortness of breath and wheezing.   Cardiovascular:  Negative for chest pain, palpitations and near-syncope.  Gastrointestinal:  Negative for abdominal pain, constipation, diarrhea, nausea and vomiting.  Genitourinary:  Negative for dysuria, flank pain and frequency.  Musculoskeletal:  Negative for back pain, neck pain and neck stiffness.  Skin:  Negative for rash and wound.  Neurological:  Positive for headaches. Negative for dizziness, seizures, speech difficulty, weakness, light-headedness, numbness, paresthesias and loss of balance.  Psychiatric/Behavioral:  Negative for agitation and  confusion.   All other systems reviewed and are negative.   Physical Exam Updated Vital Signs BP (!) 146/104 (BP Location: Right Arm)   Pulse 77   Temp 98 F (36.7 C)   Resp 18   Ht 5\' 7"  (1.702 m)   Wt 93.9 kg   SpO2 98%   BMI 32.42 kg/m  Physical Exam Vitals and nursing note reviewed.  Constitutional:      General: She is not in acute distress.    Appearance: She is well-developed. She is not ill-appearing, toxic-appearing or diaphoretic.  HENT:     Head: Normocephalic and atraumatic.     Nose: Nose normal.     Mouth/Throat:     Mouth: Mucous membranes are moist.  Eyes:     Extraocular Movements: Extraocular movements intact.     Conjunctiva/sclera: Conjunctivae normal.     Pupils: Pupils are equal, round, and reactive to light.  Cardiovascular:     Rate and Rhythm: Normal rate and regular rhythm.     Heart sounds: No murmur heard. Pulmonary:     Effort: Pulmonary effort is normal. No respiratory distress.     Breath sounds: Normal breath sounds. No wheezing, rhonchi or rales.  Chest:     Chest wall: No tenderness.  Abdominal:     Palpations: Abdomen is soft.     Tenderness: There is no abdominal tenderness. There is no guarding or rebound.  Musculoskeletal:        General: No swelling or tenderness.     Cervical back: Neck supple.     Right lower leg: No edema.     Left lower leg: No edema.  Skin:    General: Skin is warm and dry.     Capillary Refill: Capillary refill takes less than 2 seconds.     Findings: No erythema or rash.  Neurological:     General: No focal deficit present.     Mental Status: She is alert.     Sensory: No sensory deficit.     Motor: No weakness.  Psychiatric:        Mood and Affect: Mood normal.     ED Results / Procedures / Treatments   Labs (all labs ordered are listed, but only abnormal results are displayed) Labs Reviewed  URINALYSIS, ROUTINE W REFLEX MICROSCOPIC - Abnormal; Notable for the following components:       Result Value   Color, Urine RED (*)    APPearance TURBID (*)    Glucose, UA   (*)    Value: TEST NOT REPORTED DUE TO COLOR INTERFERENCE OF URINE PIGMENT   Hgb urine dipstick   (*)    Value: TEST NOT REPORTED DUE TO COLOR INTERFERENCE OF URINE PIGMENT   Bilirubin Urine   (*)    Value: TEST NOT REPORTED DUE TO COLOR INTERFERENCE OF URINE PIGMENT   Ketones, ur   (*)    Value: TEST NOT REPORTED DUE TO COLOR INTERFERENCE OF URINE PIGMENT   Protein, ur   (*)    Value: TEST NOT REPORTED DUE TO COLOR INTERFERENCE OF URINE  PIGMENT   Nitrite   (*)    Value: TEST NOT REPORTED DUE TO COLOR INTERFERENCE OF URINE PIGMENT   Leukocytes,Ua   (*)    Value: TEST NOT REPORTED DUE TO COLOR INTERFERENCE OF URINE PIGMENT   All other components within normal limits  URINALYSIS, MICROSCOPIC (REFLEX) - Abnormal; Notable for the following components:   Bacteria, UA MANY (*)    All other components within normal limits  SARS CORONAVIRUS 2 BY RT PCR  PREGNANCY, URINE  CBC WITH DIFFERENTIAL/PLATELET  BASIC METABOLIC PANEL    EKG None  Radiology No results found.  Procedures Procedures    Medications Ordered in ED Medications  sodium chloride 0.9 % bolus 1,000 mL (1,000 mLs Intravenous New Bag/Given 05/15/22 2043)  prochlorperazine (COMPAZINE) injection 10 mg (10 mg Intravenous Given 05/15/22 2043)  diphenhydrAMINE (BENADRYL) injection 50 mg (50 mg Intravenous Given 05/15/22 2044)    ED Course/ Medical Decision Making/ A&P                           Medical Decision Making Amount and/or Complexity of Data Reviewed Labs: ordered.  Risk Prescription drug management.    Tuwana Kapaun is a 21 y.o. female with a past medical history significant for asthma, hypertension, and thrombocytopenia who presents with headache.  According to patient, for the last 2 days she has been having headache with photophobia and phonophobia.  She attributes that to recently getting IUD removed on Thursday.  She says that  she had months of vaginal bleeding and is still having some improved bleeding for the last few days but not critically today.  She reports some nausea and vomiting with her headaches and reports is moderate to severe.  She has had headaches in the past.  She denies any head trauma or neck pain or neck stiffness.  Denies fevers or chills.  Denies any chest pain, abdominal pain, flank pain.  Denies any speech difficulties or vision changes.  Denies neurological plaints.  Patient went urgent care and they told her to come here if headache did not improve.  On my exam, lungs are clear.  Chest nontender.  Abdomen nontender.  Neck nontender.  Patient had no focal neurologic deficit on my exam.  Pupils symmetric and reactive extract movements.  Patient well-appearing.  Had a shared decision made conversation.  With her history of thrombocytopenia and all the bleeding with out recent hemoglobin check with her report of some fatigue as well, we will get some screening labs to look for significant anemia or thrombocytopenia however we will treat her for suspected migrainous/tension headaches with headache medications.  Patient is agreeable to this.  If work-up is reassuring, anticipate discharge after headache improves with instructions to follow-up with PCP.         10:59 PM Headache is completely resolved with medications.  She is feeling much better.  Labs show similar thrombocytopenia but otherwise labs reassuring.  She is not anemic.  Given her resolution of symptoms she would like to go home.  Patient will follow-up with PCP and continue her hematology work-up for the thrombocytopenia.  She had no other questions or concerns and was discharged in good condition with resolution of symptoms.         Final Clinical Impression(s) / ED Diagnoses Final diagnoses:  Bad headache  Fatigue, unspecified type  Thrombocytopenia (HCC)   Clinical Impression: 1. Bad headache   2. Fatigue, unspecified type    3.  Thrombocytopenia ([place])     Disposition: Discharge  Condition: Good  I have discussed the results, Dx and Tx plan with the pt(& family if present). He/she/they expressed understanding and agree(s) with the plan. Discharge instructions discussed at great length. Strict return precautions discussed and pt &/or family have verbalized understanding of the instructions. No further questions at time of discharge.    Discharge Medication List as of 05/15/2022 11:09 PM      Follow Up: Inda Coke, [place] [address] [phone]     [facility] [id number] [place] [phone]          Kennis Wissmann, Gwenyth Allegra, MD 05/15/22 [phone]

## 2022-05-15 NOTE — ED Triage Notes (Signed)
Pt here with headache and dizziness since Friday. Had her IUD taken out on Thursday after having her period for about 5 months. Vaginal bleeding has subsided a bit after having hte IUD removed, but is still present. Pt presents with HTN today.

## 2022-05-15 NOTE — ED Triage Notes (Addendum)
Pt seen by Urgent care today for tension headache. Report HA is not any better.  Reports dizziness , nausea and light sensitivity . HA since friday

## 2022-05-16 ENCOUNTER — Encounter: Payer: Self-pay | Admitting: Physician Assistant

## 2022-05-18 ENCOUNTER — Ambulatory Visit (INDEPENDENT_AMBULATORY_CARE_PROVIDER_SITE_OTHER): Payer: No Typology Code available for payment source | Admitting: Physician Assistant

## 2022-05-18 ENCOUNTER — Encounter: Payer: Self-pay | Admitting: Physician Assistant

## 2022-05-18 VITALS — BP 144/100 | HR 86 | Temp 98.4°F | Ht 67.0 in | Wt 210.2 lb

## 2022-05-18 DIAGNOSIS — G4452 New daily persistent headache (NDPH): Secondary | ICD-10-CM | POA: Diagnosis not present

## 2022-05-18 DIAGNOSIS — Z23 Encounter for immunization: Secondary | ICD-10-CM

## 2022-05-18 DIAGNOSIS — R42 Dizziness and giddiness: Secondary | ICD-10-CM

## 2022-05-18 DIAGNOSIS — D696 Thrombocytopenia, unspecified: Secondary | ICD-10-CM

## 2022-05-18 DIAGNOSIS — R030 Elevated blood-pressure reading, without diagnosis of hypertension: Secondary | ICD-10-CM

## 2022-05-18 LAB — CBC WITH DIFFERENTIAL/PLATELET
Basophils Absolute: 0 10*3/uL (ref 0.0–0.1)
Basophils Relative: 0.9 % (ref 0.0–3.0)
Eosinophils Absolute: 0.2 10*3/uL (ref 0.0–0.7)
Eosinophils Relative: 4.5 % (ref 0.0–5.0)
HCT: 41.1 % (ref 36.0–46.0)
Hemoglobin: 13.4 g/dL (ref 12.0–15.0)
Lymphocytes Relative: 41.8 % (ref 12.0–46.0)
Lymphs Abs: 2 10*3/uL (ref 0.7–4.0)
MCHC: 32.7 g/dL (ref 30.0–36.0)
MCV: 87.6 fl (ref 78.0–100.0)
Monocytes Absolute: 0.4 10*3/uL (ref 0.1–1.0)
Monocytes Relative: 8.8 % (ref 3.0–12.0)
Neutro Abs: 2.1 10*3/uL (ref 1.4–7.7)
Neutrophils Relative %: 44 % (ref 43.0–77.0)
Platelets: 140 10*3/uL — ABNORMAL LOW (ref 150.0–400.0)
RBC: 4.69 Mil/uL (ref 3.87–5.11)
RDW: 15.4 % (ref 11.5–15.5)
WBC: 4.8 10*3/uL (ref 4.0–10.5)

## 2022-05-18 LAB — T3, FREE: T3, Free: 3.4 pg/mL (ref 2.3–4.2)

## 2022-05-18 LAB — TSH: TSH: 1.68 u[IU]/mL (ref 0.35–5.50)

## 2022-05-18 LAB — T4, FREE: Free T4: 0.77 ng/dL (ref 0.60–1.60)

## 2022-05-18 MED ORDER — LOSARTAN POTASSIUM 25 MG PO TABS: 25.0000 mg | ORAL_TABLET | Freq: Every day | ORAL | 0 refills | Status: DC

## 2022-05-18 MED ORDER — MECLIZINE HCL 12.5 MG PO TABS: 12.5000 mg | ORAL_TABLET | Freq: Three times a day (TID) | ORAL | 0 refills | Status: DC | PRN

## 2022-05-18 NOTE — Progress Notes (Signed)
Katherine Parrish is a 21 y.o. female here for a follow up of a pre-existing problem.  History of Present Illness:   Chief Complaint  Patient presents with   Headache    Pt was seen in the ED on 8/28, She is still c/o persistent headache and dizziness off and on.     HPI  Headache, HTN, Dizziness She went to the Urgent Care and then ER on 05/15/22 for HA, dizziness, lightheadedness. She was found to have elevated BP and given a migraine cocktail. HA slightly improved and she went home. She had not been taking her HCTZ prior to this issue; states that her BP had been well controlled without.  Feels a spinning sensation when she looks quickly side to side and then also feels lightheaded. Symptoms started since she had her IUD removed. She had a period for 5 months after she had her mirena put in and she had this removed because of that.  Currently taking hctz 12.5 mg. At home blood pressure readings are: 157/101, 142/97. Patient denies chest pain, SOB, blurred vision,  lower leg swelling. Patient is only recently compliant with medication. Denies excessive caffeine intake, stimulant usage, excessive alcohol intake, or increase in salt consumption.  BP Readings from Last 3 Encounters:  05/18/22 (!) 140/90  05/15/22 (!) 127/90  05/15/22 (!) 146/98   Working on hydration. Has not been skipping meals. Blood work overall normal in the ER. Has upcoming appt with hematology for thrombocytopenia.  She reports that her HA was not sudden onset and is not worst HA of life. Denies vision changes or weakness.  Past Medical History:  Diagnosis Date   Asthma    Vaginal delivery 03/15/2021     Social History   Tobacco Use   Smoking status: Never   Smokeless tobacco: Never  Vaping Use   Vaping Use: Never used  Substance Use Topics   Alcohol use: No   Drug use: No    Past Surgical History:  Procedure Laterality Date   HERNIA REPAIR      Family History  Problem Relation Age of Onset    Uterine cancer Mother        leiomyosarcoma   Breast cancer Maternal Grandmother    Colon cancer Neg Hx     Allergies  Allergen Reactions   Aspirin Nausea Only    Current Medications:   Current Outpatient Medications:    albuterol (VENTOLIN HFA) 108 (90 Base) MCG/ACT inhaler, Inhale 1-2 puffs into the lungs every 6 (six) hours as needed for wheezing or shortness of breath., Disp: 18 g, Rfl: 1   hydrochlorothiazide (MICROZIDE) 12.5 MG capsule, Take 1 capsule (12.5 mg total) by mouth daily., Disp: 90 capsule, Rfl: 1   ibuprofen (ADVIL) 800 MG tablet, Take 1 tablet (800 mg total) by mouth every 8 (eight) hours as needed for up to 21 doses for fever, headache, mild pain or moderate pain., Disp: 21 tablet, Rfl: 0   montelukast (SINGULAIR) 10 MG tablet, Take 1 tablet (10 mg total) by mouth at bedtime., Disp: 90 tablet, Rfl: 3   Semaglutide-Weight Management (WEGOVY) 0.25 MG/0.5ML SOAJ, Inject 0.25 mg into the skin once a week. (Patient not taking: Reported on 05/18/2022), Disp: 2 mL, Rfl: 0   Review of Systems:   ROS Negative unless otherwise specified per HPI.   Vitals:   Vitals:   05/18/22 1126  BP: (!) 140/90  Pulse: 93  Temp: 98.4 F (36.9 C)  TempSrc: Temporal  SpO2: 98%  Weight:  210 lb 4 oz (95.4 kg)  Height: 5\' 7"  (1.702 m)     Body mass index is 32.93 kg/m.  Physical Exam:   Physical Exam Vitals and nursing note reviewed.  Constitutional:      General: She is not in acute distress.    Appearance: She is well-developed. She is not ill-appearing or toxic-appearing.  Cardiovascular:     Rate and Rhythm: Normal rate and regular rhythm.     Pulses: Normal pulses.     Heart sounds: Normal heart sounds, S1 normal and S2 normal.  Pulmonary:     Effort: Pulmonary effort is normal.     Breath sounds: Normal breath sounds.  Skin:    General: Skin is warm and dry.  Neurological:     General: No focal deficit present.     Mental Status: She is alert.     GCS: GCS eye  subscore is 4. GCS verbal subscore is 5. GCS motor subscore is 6.     Cranial Nerves: Cranial nerves 2-12 are intact.     Sensory: Sensation is intact.     Motor: Motor function is intact.     Coordination: Coordination is intact.     Gait: Gait is intact.  Psychiatric:        Speech: Speech normal.        Behavior: Behavior normal. Behavior is cooperative.    Orthostatic VS for the past 24 hrs (Last 3 readings):  BP- Lying Pulse- Lying BP- Sitting Pulse- Sitting BP- Standing at 0 minutes Pulse- Standing at 0 minutes  05/18/22 1147 (!) 144/100 86 (!) 150/110 83 (!) 166/120 76    Assessment and Plan:   Episodic lightheadedness Neuro exam benign Possibly related to her uncontrolled BP Blood work was unrevealing in ER Will try to improve blood pressure by adding on medication and follow-up  She does describe some symptoms concerning for possible vertigo -- recommend trialing Epley Maneuver vs meclizine -- if this is unhelpful or if spinning sensation worsens, will refer to vestibular rehab  Thrombocytopenia (HCC) Will recheck CBC and add smear Heme appt pending  Elevated blood pressure reading Above goal Start losartan 25 mg daily Continue hctz 12.5 mg daily Follow-up in 1 month, sooner if concerns Keep close eye on BP No evidence of end organ damage  Need for immunization against influenza Updated today  New daily persistent headache No red flags Neuro exam is WNL Will trial improving blood pressure to see if this helps Consider imaging if does not improve If worsens, needs to return to ER   05/20/22, PA-C

## 2022-05-18 NOTE — Patient Instructions (Signed)
It was great to see you!  Start losartan 25 mg daily Continue HCTZ 12.5 mg daily  Follow-up in 1 month -- sooner if concerns  Please keep track of your blood pressure for me  Take care,  Jarold Motto PA-C

## 2022-05-19 ENCOUNTER — Ambulatory Visit: Payer: No Typology Code available for payment source | Admitting: Physician Assistant

## 2022-05-19 LAB — PATHOLOGIST SMEAR REVIEW

## 2022-05-24 ENCOUNTER — Encounter: Payer: Self-pay | Admitting: Physician Assistant

## 2022-05-24 ENCOUNTER — Telehealth: Payer: Self-pay | Admitting: Physician Assistant

## 2022-05-24 ENCOUNTER — Other Ambulatory Visit: Payer: Self-pay | Admitting: Physician Assistant

## 2022-05-24 DIAGNOSIS — R030 Elevated blood-pressure reading, without diagnosis of hypertension: Secondary | ICD-10-CM

## 2022-05-24 NOTE — Telephone Encounter (Signed)
..  Type of form received: fmla   Additional comments:    Received by: fax   Form should be Faxed [phone]  Form should be mailed to:  na  Is patient requesting call for pickup: no    Form placed:  samanthas folder  Attach charge sheet. yes  Individual made aware of 3-5 business day turn around (Y/N)?

## 2022-05-26 ENCOUNTER — Ambulatory Visit: Payer: No Typology Code available for payment source | Attending: Cardiology | Admitting: Cardiology

## 2022-05-26 ENCOUNTER — Ambulatory Visit (HOSPITAL_COMMUNITY)
Admission: RE | Admit: 2022-05-26 | Discharge: 2022-05-26 | Disposition: A | Discharge status: Home / Self Care | Payer: No Typology Code available for payment source | Source: Ambulatory Visit | Attending: Physician Assistant | Admitting: Physician Assistant

## 2022-05-26 ENCOUNTER — Encounter (HOSPITAL_COMMUNITY): Payer: Self-pay

## 2022-05-26 ENCOUNTER — Encounter: Payer: Self-pay | Admitting: Physician Assistant

## 2022-05-26 VITALS — BP 126/82 | Ht 67.0 in | Wt 210.0 lb

## 2022-05-26 VITALS — BP 129/80 | HR 87 | Temp 98.1°F | Resp 18

## 2022-05-26 DIAGNOSIS — Z8759 Personal history of other complications of pregnancy, childbirth and the puerperium: Secondary | ICD-10-CM | POA: Diagnosis not present

## 2022-05-26 DIAGNOSIS — I10 Essential (primary) hypertension: Secondary | ICD-10-CM

## 2022-05-26 DIAGNOSIS — J039 Acute tonsillitis, unspecified: Secondary | ICD-10-CM

## 2022-05-26 LAB — POCT INFECTIOUS MONO SCREEN, ED / UC: Mono Screen: NEGATIVE

## 2022-05-26 LAB — POCT RAPID STREP A, ED / UC: Streptococcus, Group A Screen (Direct): NEGATIVE

## 2022-05-26 MED ORDER — AMOXICILLIN-POT CLAVULANATE 875-125 MG PO TABS: 1.0000 | ORAL_TABLET | Freq: Two times a day (BID) | ORAL | 0 refills | Status: DC

## 2022-05-26 NOTE — Patient Instructions (Signed)
Medication Instructions:  Your physician recommends that you continue on your current medications as directed. Please refer to the Current Medication list given to you today.  *If you need a refill on your cardiac medications before your next appointment, please call your pharmacy*   Lab Work: None   Testing/Procedures: Your physician has requested that you have an echocardiogram. Echocardiography is a painless test that uses sound waves to create images of your heart. It provides your doctor with information about the size and shape of your heart and how well your heart's chambers and valves are working. This procedure takes approximately one hour. There are no restrictions for this procedure.    Follow-Up: At [facility], you and your health needs are our priority.  As part of our continuing mission to provide you with exceptional heart care, we have created designated Provider Care Teams.  These Care Teams include your primary Cardiologist (physician) and Advanced Practice Providers (APPs -  Physician Assistants and Nurse Practitioners) who all work together to provide you with the care you need, when you need it.  We recommend signing up for the patient portal called "MyChart".  Sign up information is provided on this After Visit Summary.  MyChart is used to connect with patients for Virtual Visits (Telemedicine).  Patients are able to view lab/test results, encounter notes, upcoming appointments, etc.  Non-urgent messages can be sent to your provider as well.   To learn more about what you can do with MyChart, go to [email or web address].     The format for your next appointment:   In Person  Provider:   Thomasene Ripple, DO

## 2022-05-26 NOTE — Discharge Instructions (Signed)
Your strep was negative.  Your mono was negative as well.  I have called in an antibiotic to cover for infection.  Gargle with warm salt water and use Tylenol ibuprofen for pain.  I would like you to follow-up with an ENT.  Please call and schedule an appointment first thing Monday.  If you have any worsening symptoms including spread of pain, enlarging tonsil, difficulty swallowing, shortness of breath, muffled voice, fever, nausea, vomiting you need to go to the emergency room immediately.

## 2022-05-26 NOTE — ED Provider Notes (Signed)
[facility]    CSN: [id number] Arrival date & time: 05/26/22  1724      History   Chief Complaint Chief Complaint  Patient presents with   Sore Throat    Throat/Neck Pain, facial pain, ear pain. All right side. Neck/face inflamed slightly - Entered by patient   Otalgia    HPI Katherine Parrish is a 21 y.o. female.   Patient presents today with a 4 to 5-day history of sore throat.  This is worse on the right side and has currently rated 7 on a 0-10 pain scale, described as aching, no aggravating or alleviating factors identified.  She reports the pain is now traveling into her right ear.  She denies any cough, congestion, fever, nausea, vomiting.  She has been taking some Naprosyn intermittently without improvement of symptoms.  Denies any recent antibiotics or steroids.  Denies any known sick contacts.  She does have a history of tonsillar hypertrophy but has not seen ENT.  She is able to eat and drink normally without difficulty.  Denies any swelling of her throat, shortness of breath, muffled voice.    Past Medical History:  Diagnosis Date   Asthma    Vaginal delivery 03/15/2021    Patient Active Problem List   Diagnosis Date Noted   Vitamin D deficiency 12/14/2021   Asthma 06/03/2021   Chronic hypertension with superimposed pre-eclampsia 03/14/2021   Thrombocytopenia (HCC) 03/14/2021   Mild intermittent asthma without complication 12/13/2017    Past Surgical History:  Procedure Laterality Date   HERNIA REPAIR      OB History     Gravida  1   Para      Term      Preterm      AB      Living         SAB      IAB      Ectopic      Multiple      Live Births               Home Medications    Prior to Admission medications   Medication Sig Start Date End Date Taking? Authorizing Provider  amoxicillin-clavulanate (AUGMENTIN) 875-125 MG tablet Take 1 tablet by mouth every 12 (twelve) hours. 05/26/22  Yes Eryanna Regal K, PA-C  albuterol  (VENTOLIN HFA) 108 (90 Base) MCG/ACT inhaler Inhale 1-2 puffs into the lungs every 6 (six) hours as needed for wheezing or shortness of breath. 08/04/21   Jarold Motto, PA  hydrochlorothiazide (MICROZIDE) 12.5 MG capsule Take 1 capsule (12.5 mg total) by mouth daily. 03/31/22   Jarold Motto, PA  losartan (COZAAR) 25 MG tablet Take 1 tablet (25 mg total) by mouth daily. 05/18/22   Jarold Motto, PA  meclizine (ANTIVERT) 12.5 MG tablet Take 1 tablet (12.5 mg total) by mouth 3 (three) times daily as needed for dizziness. 05/18/22   Jarold Motto, PA  montelukast (SINGULAIR) 10 MG tablet Take 1 tablet (10 mg total) by mouth at bedtime. 03/31/22   Jarold Motto, PA  Semaglutide-Weight Management (WEGOVY) 0.25 MG/0.5ML SOAJ Inject 0.25 mg into the skin once a week. 04/21/22   Jarold Motto, PA  Fluticasone Propionate, Inhal, 100 MCG/ACT AEPB Inhale 1 puff into the lungs in the morning and at bedtime. 10/05/21 10/06/21  Jarold Motto, PA    Family History Family History  Problem Relation Age of Onset   Uterine cancer Mother        leiomyosarcoma   Hypertension  Mother    Thyroid disease Mother    Breast cancer Maternal Grandmother    Colon cancer Neg Hx     Social History Social History   Tobacco Use   Smoking status: Never   Smokeless tobacco: Never  Vaping Use   Vaping Use: Never used  Substance Use Topics   Alcohol use: No   Drug use: No     Allergies   Aspirin   Review of Systems Review of Systems  Constitutional:  Positive for activity change. Negative for appetite change, fatigue and fever.  HENT:  Positive for ear pain and sore throat. Negative for congestion, sinus pressure, sneezing, trouble swallowing and voice change.   Respiratory:  Negative for cough and shortness of breath.   Cardiovascular:  Negative for chest pain.  Gastrointestinal:  Negative for abdominal pain, diarrhea, nausea and vomiting.     Physical Exam Triage Vital Signs ED Triage  Vitals [05/26/22 1807]  Enc Vitals Group     BP 129/80     Pulse Rate 87     Resp 18     Temp 98.1 F (36.7 C)     Temp Source Oral     SpO2 96 %     Weight      Height      Head Circumference      Peak Flow      Pain Score 7     Pain Loc      Pain Edu?      Excl. in GC?    No data found.  Updated Vital Signs BP 129/80 (BP Location: Left Arm)   Pulse 87   Temp 98.1 F (36.7 C) (Oral)   Resp 18   SpO2 96%   Visual Acuity Right Eye Distance:   Left Eye Distance:   Bilateral Distance:    Right Eye Near:   Left Eye Near:    Bilateral Near:     Physical Exam Vitals reviewed.  Constitutional:      General: She is awake. She is not in acute distress.    Appearance: Normal appearance. She is well-developed. She is not ill-appearing.     Comments: Very pleasant female appears stated age in no acute distress sitting comfortably in exam room  HENT:     Head: Normocephalic and atraumatic.     Right Ear: Tympanic membrane, ear canal and external ear normal. Tympanic membrane is not erythematous or bulging.     Left Ear: Tympanic membrane, ear canal and external ear normal. Tympanic membrane is not erythematous or bulging.     Nose:     Right Sinus: No maxillary sinus tenderness or frontal sinus tenderness.     Left Sinus: No maxillary sinus tenderness or frontal sinus tenderness.     Mouth/Throat:     Pharynx: Uvula midline. No oropharyngeal exudate or posterior oropharyngeal erythema.     Tonsils: Tonsillar exudate present. No tonsillar abscesses. 2+ on the right. 1+ on the left.  Cardiovascular:     Rate and Rhythm: Normal rate and regular rhythm.     Heart sounds: Normal heart sounds, S1 normal and S2 normal. No murmur heard. Pulmonary:     Effort: Pulmonary effort is normal.     Breath sounds: Normal breath sounds. No wheezing, rhonchi or rales.     Comments: Clear to auscultation bilaterally Psychiatric:        Behavior: Behavior is cooperative.      UC  Treatments / Results  Labs (all  labs ordered are listed, but only abnormal results are displayed) Labs Reviewed  POCT RAPID STREP A, ED / UC  POCT INFECTIOUS MONO SCREEN, ED / UC    EKG   Radiology No results found.  Procedures Procedures (including critical care time)  Medications Ordered in UC Medications - No data to display  Initial Impression / Assessment and Plan / UC Course  I have reviewed the triage vital signs and the nursing notes.  Pertinent labs & imaging results that were available during my care of the patient were reviewed by me and considered in my medical decision making (see chart for details).     Patient is well-appearing, afebrile, nontoxic, nontachycardic.  Strep testing was negative in clinic.  Mono testing was negative as well.  Given significant exudate with associated asymmetric tonsils will cover for bacterial etiology.  Augmentin twice daily for 10 days sent to pharmacy.  Uvula is midline low concern for peritonsillar abscess but discussed that if her tonsils enlarging or she has any additional symptoms she needs to be seen immediately.  Given asymmetric tonsils recommend she follow-up with ENT.  She was given contact information for local provider with instruction to call to schedule appointment.  Discussed that if she has any worsening symptoms including swelling of her throat, enlarging tonsil, muffled voice, dysphagia, odynophagia, fever, nausea, vomiting she is to go to the emergency room to which she expressed understanding.  Strict return precautions given.  Work excuse note provided.  Final Clinical Impressions(s) / UC Diagnoses   Final diagnoses:  Acute tonsillitis, unspecified etiology     Discharge Instructions      Your strep was negative.  Your mono was negative as well.  I have called in an antibiotic to cover for infection.  Gargle with warm salt water and use Tylenol ibuprofen for pain.  I would like you to follow-up with an ENT.   Please call and schedule an appointment first thing Monday.  If you have any worsening symptoms including spread of pain, enlarging tonsil, difficulty swallowing, shortness of breath, muffled voice, fever, nausea, vomiting you need to go to the emergency room immediately.     ED Prescriptions     Medication Sig Dispense Auth. Provider   amoxicillin-clavulanate (AUGMENTIN) 875-125 MG tablet Take 1 tablet by mouth every 12 (twelve) hours. 20 tablet Sarahbeth Cashin, Noberto Retort, PA-C      PDMP not reviewed this encounter.   Jeani Hawking, PA-C 05/26/22 1924

## 2022-05-26 NOTE — Progress Notes (Addendum)
Cardiology Office Note:    Date:  05/26/2022   ID:  Katherine Parrish, DOB [birth year], MRN [id number]  PCP:  Jarold Motto, PA  Cardiologist:  Thomasene Ripple, DO  Electrophysiologist:  None   Referring MD: Jarold Motto, PA   " I am doing well"  History of Present Illness:    Katherine Parrish is a 21 y.o. female with a hx of asthma, hypertension which was diagnosed about a year ago is all started during her pregnancy when she was hypertensive developed preeclampsia ended up being induced for delivery since that time she had been placed on hydrochlorothiazide.  Recently her blood pressure increase and she was then seen by her PCP who ordered losartan.  She is experiencing increase sweating since starting the losartan which is now uncomfortable.   Past Medical History:  Diagnosis Date   Asthma    Vaginal delivery 03/15/2021    Past Surgical History:  Procedure Laterality Date   HERNIA REPAIR      Current Medications: Current Meds  Medication Sig   albuterol (VENTOLIN HFA) 108 (90 Base) MCG/ACT inhaler Inhale 1-2 puffs into the lungs every 6 (six) hours as needed for wheezing or shortness of breath.   hydrochlorothiazide (MICROZIDE) 12.5 MG capsule Take 1 capsule (12.5 mg total) by mouth daily.   losartan (COZAAR) 25 MG tablet Take 1 tablet (25 mg total) by mouth daily.   meclizine (ANTIVERT) 12.5 MG tablet Take 1 tablet (12.5 mg total) by mouth 3 (three) times daily as needed for dizziness.   montelukast (SINGULAIR) 10 MG tablet Take 1 tablet (10 mg total) by mouth at bedtime.   Semaglutide-Weight Management (WEGOVY) 0.25 MG/0.5ML SOAJ Inject 0.25 mg into the skin once a week.     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 There is no history of Colon cancer.  ROS:   Review of Systems  Constitution: Negative for decreased appetite, fever and weight gain.  HENT: Negative for congestion, ear discharge, hoarse voice and sore throat.   Eyes: Negative for discharge, redness, vision loss in right eye and visual halos.  Cardiovascular: Negative for chest pain, dyspnea on exertion, leg swelling, orthopnea and palpitations.  Respiratory: Negative for cough, hemoptysis, shortness of breath and snoring.   Endocrine: Negative for heat intolerance and polyphagia.  Hematologic/Lymphatic: Negative for bleeding problem. Does  not bruise/bleed easily.  Skin: Negative for flushing, nail changes, rash and suspicious lesions.  Musculoskeletal: Negative for arthritis, joint pain, muscle cramps, myalgias, neck pain and stiffness.  Gastrointestinal: Negative for abdominal pain, bowel incontinence, diarrhea and excessive appetite.  Genitourinary: Negative for decreased libido, genital sores and incomplete emptying.  Neurological: Negative for brief paralysis, focal weakness, headaches and loss of balance.  Psychiatric/Behavioral: Negative for altered mental status, depression and suicidal ideas.   Allergic/Immunologic: Negative for HIV exposure and persistent infections.    EKGs/Labs/Other Studies Reviewed:    The following studies were reviewed today:   EKG:  The ekg ordered today demonstrates sinus rhythm, heart rate 85 bpm.  Recent Labs: 03/31/2022: ALT 15 05/15/2022: BUN 13; Creatinine, Ser 0.64; Potassium 4.2; Sodium 137 05/18/2022: Hemoglobin 13.4; Platelets 140.0; TSH 1.68  Recent Lipid Panel    Component Value Date/Time   CHOL 215 (H) 03/31/2022 1047   TRIG 132.0 03/31/2022 1047   HDL 57.10 03/31/2022 1047   CHOLHDL 4 03/31/2022 1047   VLDL 26.4 03/31/2022 1047   LDLCALC 131 (H) 03/31/2022 1047    Physical Exam:    VS:  BP 126/82 (BP Location: Right Arm)   Ht 5\' 7"  (1.702 m)   Wt 210 lb (95.3 kg)   SpO2 98%   BMI 32.89 kg/m     Wt Readings from Last 3 Encounters:  05/26/22 210 lb (95.3 kg)  05/18/22 210 lb 4 oz (95.4 kg)  05/15/22 207 lb (93.9 kg)     GEN: Well nourished, well developed in no acute distress HEENT: Normal NECK: No JVD; No carotid bruits LYMPHATICS: No lymphadenopathy CARDIAC: S1S2 noted,RRR, no murmurs, rubs, gallops RESPIRATORY:  Clear to auscultation without rales, wheezing or rhonchi  ABDOMEN: Soft, non-tender, non-distended, +bowel sounds, no guarding. EXTREMITIES: No edema, No cyanosis, no clubbing MUSCULOSKELETAL:  No deformity  SKIN: Warm and dry NEUROLOGIC:  Alert and oriented x 3, non-focal PSYCHIATRIC:  Normal affect, good insight  ASSESSMENT:    1. Primary hypertension   2. History of pre-eclampsia   3. Morbid obesity (HCC)    PLAN:     Hypertension-blood pressure is acceptable in the office today.  I would prefer to keep her on the hydrochlorothiazide 12.5 mg daily. Stop the losartan for now and start Amlodipine 5 mg daily- can increase to 10 mg if needed.  We will get an echocardiogram to assess for any structural abnormalities to make sure that in this area patient there is no secondary contributory factor from  structural heart disease. The patient understands the need to lose weight with diet and exercise. We have discussed specific strategies for this.  The patient is in agreement with the above plan. The patient left the office in stable condition.  The patient will follow up  as needed   Medication Adjustments/Labs and Tests Ordered: Current medicines are reviewed at length with the patient today.  Concerns regarding medicines are outlined above.  Orders Placed This Encounter  Procedures   ECHOCARDIOGRAM COMPLETE   No orders of the defined types were placed in this encounter.   Patient Instructions  Medication Instructions:  Your physician recommends that you continue on your current medications as directed. Please refer to the Current Medication list given to you today.  *If you need a refill on your cardiac medications before your next appointment, please call your pharmacy*   Lab Work: None   Testing/Procedures: Your physician has requested that you have an echocardiogram. Echocardiography is a painless test that uses  sound waves to create images of your heart. It provides your doctor with information about the size and shape of your heart and how well your heart's chambers and valves are working. This procedure takes approximately one hour. There are no restrictions for this procedure.    Follow-Up: At [facility], you and your health needs are our priority.  As part of our continuing mission to provide you with exceptional heart care, we have created designated Provider Care Teams.  These Care Teams include your primary Cardiologist (physician) and Advanced Practice Providers (APPs -  Physician Assistants and Nurse Practitioners) who all work together to provide you with the care you need, when you need it.  We recommend signing up for the patient portal called "MyChart".  Sign up information is provided on this After Visit Summary.  MyChart is used to connect with  patients for Virtual Visits (Telemedicine).  Patients are able to view lab/test results, encounter notes, upcoming appointments, etc.  Non-urgent messages can be sent to your provider as well.   To learn more about what you can do with MyChart, go to [email or web address].     The format for your next appointment:   In Person  Provider:   Berniece Salines, DO       Adopting a Healthy Lifestyle.  Know what a healthy weight is for you (roughly BMI <25) and aim to maintain this   Aim for 7+ servings of fruits and vegetables daily   65-80+ fluid ounces of water or unsweet tea for healthy kidneys   Limit to max 1 drink of alcohol per day; avoid smoking/tobacco   Limit animal fats in diet for cholesterol and heart health - choose grass fed whenever available   Avoid highly processed foods, and foods high in saturated/trans fats   Aim for low stress - take time to unwind and care for your mental health   Aim for 150 min of moderate intensity exercise weekly for heart health, and weights twice weekly for bone health   Aim for 7-9 hours of sleep daily   When it comes to diets, agreement about the perfect plan isnt easy to find, even among the experts. Experts at the [employer] developed an idea known as the Healthy Eating Plate. Just imagine a plate divided into logical, healthy portions.   The emphasis is on diet quality:   Load up on vegetables and fruits - one-half of your plate: Aim for color and variety, and remember that potatoes dont count.   Go for whole grains - one-quarter of your plate: Whole wheat, barley, wheat berries, quinoa, oats, brown rice, and foods made with them. If you want pasta, go with whole wheat pasta.   Protein power - one-quarter of your plate: Fish, chicken, beans, and nuts are all healthy, versatile protein sources. Limit red meat.   The diet, however, does go beyond the plate, offering a few other suggestions.   Use healthy plant  oils, such as olive, canola, soy, corn, sunflower and peanut. Check the labels, and avoid partially hydrogenated oil, which have unhealthy trans fats.   If youre thirsty, drink water. Coffee and tea are good in moderation, but skip sugary drinks and limit milk and dairy products to one or two daily servings.   The type of carbohydrate in the diet is more important than the amount. Some sources of carbohydrates, such as vegetables, fruits, whole grains, and beans-are healthier than others.   Finally, stay active  Osvaldo Shipper, DO  05/26/2022 9:16 AM    [facility]

## 2022-05-26 NOTE — ED Triage Notes (Signed)
Pt c/o sore throat since Monday with post nasal drainage. C/o rt ear pain today radiating down her neck. Denies taking any meds today.

## 2022-05-26 NOTE — Telephone Encounter (Signed)
Form placed in provider's folder for review and signature.

## 2022-05-29 ENCOUNTER — Encounter: Payer: No Typology Code available for payment source | Admitting: Licensed Clinical Social Worker

## 2022-05-29 ENCOUNTER — Other Ambulatory Visit: Payer: Self-pay

## 2022-05-29 ENCOUNTER — Other Ambulatory Visit: Payer: No Typology Code available for payment source

## 2022-05-29 ENCOUNTER — Other Ambulatory Visit (HOSPITAL_COMMUNITY): Payer: Self-pay

## 2022-05-29 MED ORDER — AMLODIPINE BESYLATE 5 MG PO TABS
5.0000 mg | ORAL_TABLET | Freq: Every day | ORAL | 3 refills | Status: DC
Start: 2022-05-29 — End: 2022-12-18
  Filled 2022-05-29: qty 90, 90d supply, fill #0
  Filled 2022-08-27: qty 90, 90d supply, fill #1

## 2022-05-29 NOTE — Progress Notes (Signed)
Please stop the losartan and start Amlodipine 5mg  per Dr. , patient is experiencing significant sweating on Losartan. Prescription sent to pharmacy. Medication list updated.

## 2022-05-29 NOTE — Addendum Note (Signed)
Addended by: Reynolds Bowl on: 05/29/2022 10:34 AM   Modules accepted: Orders

## 2022-06-02 ENCOUNTER — Encounter: Payer: Self-pay | Admitting: Physician Assistant

## 2022-06-08 NOTE — Progress Notes (Signed)
[facility] [phone]   [phone]  INITIAL CONSULT NOTE  Patient Care Team: Jarold Motto, [name] as PCP - General (Physician Assistant) Thomasene Ripple, DO as PCP - Cardiology (Cardiology)   CHIEF COMPLAINTS/PURPOSE OF CONSULTATION:  Thrombocytopenia  HISTORY OF PRESENTING ILLNESS:  Katherine Parrish 21 y.o. female with medical history significant for asthma presents to the hematology clinic for initial evaluation for thrombocytopenia.  She is unaccompanied for this visit.  On review of the previous records, there is evidence of mild thrombocytopenia as far back as March 2020.  Most recent labs from 05/18/2022 shows overall stable thrombocytopenia with a platelet count of 140 K/uL.   On exam today, Katherine Parrish reports that her energy levels are stable and she can complete all her daily activities on her own.  She denies any appetite changes or weight loss.  She denies any GI symptoms including nausea, vomiting, diarrhea or constipation.  She orts that she bruises easily but denies any active bleeding.  Her menstrual cycles were heavy while she was postpartum and while on Mirena IUD.  She recently removed her IUD last month and notes that her menstrual cycles have improved.  She denies fevers, chills, night sweats, shortness of breath, chest pain or cough.  He has no other complaints.  Rest of the 10 point ROS is below.  MEDICAL HISTORY:  Past Medical History:  Diagnosis Date   Asthma    Vaginal delivery 03/15/2021    SURGICAL HISTORY: Past Surgical History:  Procedure Laterality Date   HERNIA REPAIR      SOCIAL HISTORY: Social History   Socioeconomic History   Marital status: Single    Spouse name: Not on file   Number of children: Not on file   Years of education: Not on file   Highest education level: Not on file  Occupational History   Not on file  Tobacco Use   Smoking status: Never   Smokeless tobacco: Never  Vaping Use   Vaping Use:  Never used  Substance and Sexual Activity   Alcohol use: No   Drug use: No   Sexual activity: Not Currently    Birth control/protection: None  Other Topics Concern   Not on file  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  MEDICATIONS:  Current Outpatient Medications  Medication Sig Dispense Refill   albuterol (VENTOLIN HFA) 108 (90 Base) MCG/ACT inhaler Inhale 1-2 puffs into the lungs every 6 (six) hours as needed for wheezing or shortness of breath. 18 g 1   amLODipine (NORVASC) 5 MG tablet Take 1 tablet (5 mg total) by mouth daily. 90 tablet 3   hydrochlorothiazide (MICROZIDE) 12.5 MG capsule Take 1 capsule (12.5 mg total) by mouth daily. 90 capsule 1   meclizine (ANTIVERT) 12.5 MG tablet Take  1 tablet (12.5 mg total) by mouth 3 (three) times daily as needed for dizziness. 30 tablet 0   montelukast (SINGULAIR) 10 MG tablet Take 1 tablet (10 mg total) by mouth at bedtime. 90 tablet 3   Semaglutide-Weight Management (WEGOVY) 0.25 MG/0.5ML SOAJ Inject 0.25 mg into the skin once a week. 2 mL 0   amoxicillin-clavulanate (AUGMENTIN) 875-125 MG tablet Take 1 tablet by mouth every 12 (twelve) hours. 20 tablet 0   No current facility-administered medications for this visit.    REVIEW OF SYSTEMS:   Constitutional: ( - ) fevers, ( - )  chills , ( - ) night sweats Eyes: ( - ) blurriness of vision, ( - ) double vision, ( - )  watery eyes Ears, nose, mouth, throat, and face: ( - ) mucositis, ( - ) sore throat Respiratory: ( - ) cough, ( - ) dyspnea, ( - ) wheezes Cardiovascular: ( - ) palpitation, ( - ) chest discomfort, ( - ) lower extremity swelling Gastrointestinal:  ( - ) nausea, ( - ) heartburn, ( - ) change in bowel habits Skin: ( - ) abnormal skin rashes Lymphatics: ( - ) new lymphadenopathy, ( + ) easy bruising Neurological: ( - ) numbness, ( - ) tingling, ( - ) new weaknesses Behavioral/Psych: ( - ) mood change, ( - ) new changes  All other systems were reviewed with the patient and are negative.  PHYSICAL EXAMINATION: ECOG PERFORMANCE STATUS: 1 - Symptomatic but completely ambulatory  Vitals:   06/09/22 1057  BP: 129/77  Pulse: 81  Resp: 17  Temp: 97.8 F (36.6 C)  SpO2: 98%   Filed Weights   06/09/22 1057  Weight: 216 lb 6.4 oz (98.2 kg)    GENERAL: well appearing female in NAD  SKIN: skin color, texture, turgor are normal, no rashes or significant lesions EYES: conjunctiva are pink and non-injected, sclera clear OROPHARYNX: no exudate, no erythema; lips, buccal mucosa, and tongue normal  NECK: supple, non-tender LYMPH:  no palpable lymphadenopathy in the cervical or supraclavicular lymph nodes.  LUNGS: clear to auscultation and percussion with normal breathing effort HEART: regular rate & rhythm and no murmurs and no lower extremity edema Musculoskeletal: no cyanosis of digits and no clubbing  PSYCH: alert & oriented x 3, fluent speech NEURO: no focal motor/sensory deficits  LABORATORY DATA:  I have reviewed the data as listed    Latest Ref Rng & Units 06/09/2022   12:10 PM 05/18/2022   12:20 PM 05/15/2022    8:41 PM  CBC  WBC 4.0 - 10.5 K/uL 4.5  4.8  6.1   Hemoglobin 12.0 - 15.0 g/dL 12.5  13.4  12.6   Hematocrit 36.0 - 46.0 % 37.8  41.1  38.5   Platelets 150 - 400 K/uL 153  140.0  126        Latest Ref Rng & Units 06/09/2022   12:10 PM 05/15/2022    8:41 PM 03/31/2022    10:47 AM  CMP  Glucose 70 - 99 mg/dL 93  88  97   BUN 6 - 20 mg/dL 19  13  13    Creatinine 0.44 - 1.00 mg/dL 0.64  0.64  0.70   Sodium 135 - 145 mmol/L 139  137  140   Potassium 3.5 - 5.1 mmol/L 3.6  4.2  4.1   Chloride 98 - 111 mmol/L 104  105  100   CO2 22 - 32 mmol/L 31  26  29    Calcium  8.9 - 10.3 mg/dL 9.7  9.3  9.9   Total Protein 6.5 - 8.1 g/dL 7.8   8.4   Total Bilirubin 0.3 - 1.2 mg/dL 0.4   0.4   Alkaline Phos 38 - 126 U/L 61   59   AST 15 - 41 U/L 18   24   ALT 0 - 44 U/L 12   15     ASSESSMENT & PLAN Katherine Parrish is a 21 y.o. female who presents to the hematology clinic for evaluation for thrombocytopenia. I reviewed potential etiologies for thrombocytopenia including liver disease, splenomegaly, infectious processes, nutritional anemias, immune mediated and bone marrow disorders. Patient will proceed with serologic workup today for initial evaluation. We will call patient by phone to review the results in the next 1-2 days.   #Thrombocytopenia-mild: --Labs from 10/13/2021 showed no evidence of Hepatitis C or HIV --Proceed with labs today to check CBC w/diff, CMP, B12 level, folate level, Hep B serology and immature platelet fraction.  --If above workup is negative, we will order abdominal US to evaluate for liver disease and/or splenomegaly  --RTC based on above workup.    Orders Placed This Encounter  Procedures   CBC with Differential (Cancer Center Only)    Standing Status:   Future    Number of Occurrences:   1    Standing Expiration Date:   06/10/2023   CMP (Cancer Center only)    Standing Status:   Future    Number of Occurrences:   1    Standing Expiration Date:   06/10/2023   Save Smear for Provider Slide Review    Standing Status:   Future    Number of Occurrences:   1    Standing Expiration Date:   06/10/2023   Vitamin B12    Standing Status:   Future    Number of Occurrences:   1    Standing Expiration Date:   06/09/2023   Methylmalonic acid, serum     Standing Status:   Future    Number of Occurrences:   1    Standing Expiration Date:   06/09/2023   Folate, Serum    Standing Status:   Future    Number of Occurrences:   1    Standing Expiration Date:   06/09/2023   Hepatitis B core antibody, total    Standing Status:   Future    Number of Occurrences:   1    Standing Expiration Date:   06/09/2023   Hepatitis B surface antibody    Standing Status:   Future    Number of Occurrences:   1    Standing Expiration Date:   06/09/2023   Hepatitis B surface antigen    Standing Status:   Future    Number of Occurrences:   1    Standing Expiration Date:   06/09/2023   Immature Platelet Fraction    Standing Status:   Future    Number of Occurrences:   1    Standing Expiration Date:   06/10/2023    All questions were answered. The patient knows to call the clinic with any problems, questions or concerns.  I have spent a total of 60 minutes minutes of face-to-face and non-face-to-face time, preparing to see the patient, obtaining and/or reviewing separately obtained history, performing a medically appropriate examination, counseling and educating the patient, ordering tests/procedures, documenting clinical information in the electronic health record, and care coordination.   Georga Kaufmann, PA-C Department of Hematology/Oncology  [facility] Phone: [phone]

## 2022-06-09 ENCOUNTER — Ambulatory Visit (INDEPENDENT_AMBULATORY_CARE_PROVIDER_SITE_OTHER)
Admission: RE | Admit: 2022-06-09 | Discharge: 2022-06-09 | Disposition: A | Discharge status: Home / Self Care | Payer: No Typology Code available for payment source | Source: Ambulatory Visit

## 2022-06-09 ENCOUNTER — Inpatient Hospital Stay: Payer: No Typology Code available for payment source | Attending: Physician Assistant | Admitting: Physician Assistant

## 2022-06-09 ENCOUNTER — Ambulatory Visit (HOSPITAL_BASED_OUTPATIENT_CLINIC_OR_DEPARTMENT_OTHER): Payer: No Typology Code available for payment source

## 2022-06-09 ENCOUNTER — Encounter: Payer: Self-pay | Admitting: Physician Assistant

## 2022-06-09 ENCOUNTER — Other Ambulatory Visit: Payer: Self-pay

## 2022-06-09 ENCOUNTER — Inpatient Hospital Stay: Payer: No Typology Code available for payment source

## 2022-06-09 VITALS — BP 129/77 | HR 81 | Temp 97.8°F | Resp 17 | Ht 67.0 in | Wt 216.4 lb

## 2022-06-09 VITALS — BP 118/77 | HR 80 | Temp 98.3°F | Resp 18

## 2022-06-09 DIAGNOSIS — E669 Obesity, unspecified: Secondary | ICD-10-CM | POA: Insufficient documentation

## 2022-06-09 DIAGNOSIS — I071 Rheumatic tricuspid insufficiency: Secondary | ICD-10-CM | POA: Diagnosis not present

## 2022-06-09 DIAGNOSIS — I3139 Other pericardial effusion (noninflammatory): Secondary | ICD-10-CM | POA: Diagnosis not present

## 2022-06-09 DIAGNOSIS — I10 Essential (primary) hypertension: Secondary | ICD-10-CM | POA: Insufficient documentation

## 2022-06-09 DIAGNOSIS — I340 Nonrheumatic mitral (valve) insufficiency: Secondary | ICD-10-CM | POA: Diagnosis not present

## 2022-06-09 DIAGNOSIS — D696 Thrombocytopenia, unspecified: Secondary | ICD-10-CM

## 2022-06-09 DIAGNOSIS — B9689 Other specified bacterial agents as the cause of diseases classified elsewhere: Secondary | ICD-10-CM | POA: Insufficient documentation

## 2022-06-09 DIAGNOSIS — Z803 Family history of malignant neoplasm of breast: Secondary | ICD-10-CM | POA: Diagnosis not present

## 2022-06-09 DIAGNOSIS — J038 Acute tonsillitis due to other specified organisms: Secondary | ICD-10-CM | POA: Insufficient documentation

## 2022-06-09 DIAGNOSIS — Z8049 Family history of malignant neoplasm of other genital organs: Secondary | ICD-10-CM | POA: Diagnosis not present

## 2022-06-09 LAB — CMP (CANCER CENTER ONLY)
ALT: 12 U/L (ref 0–44)
AST: 18 U/L (ref 15–41)
Albumin: 4.4 g/dL (ref 3.5–5.0)
Alkaline Phosphatase: 61 U/L (ref 38–126)
Anion gap: 4 — ABNORMAL LOW (ref 5–15)
BUN: 19 mg/dL (ref 6–20)
CO2: 31 mmol/L (ref 22–32)
Calcium: 9.7 mg/dL (ref 8.9–10.3)
Chloride: 104 mmol/L (ref 98–111)
Creatinine: 0.64 mg/dL (ref 0.44–1.00)
GFR, Estimated: >60 mL/min (ref >60)
Glucose, Bld: 93 mg/dL (ref 70–99)
Potassium: 3.6 mmol/L (ref 3.5–5.1)
Sodium: 139 mmol/L (ref 135–145)
Total Bilirubin: 0.4 mg/dL (ref 0.3–1.2)
Total Protein: 7.8 g/dL (ref 6.5–8.1)

## 2022-06-09 LAB — CBC WITH DIFFERENTIAL (CANCER CENTER ONLY)
Abs Immature Granulocytes: 0.01 10*3/uL (ref 0.00–0.07)
Basophils Absolute: 0 10*3/uL (ref 0.0–0.1)
Basophils Relative: 0 %
Eosinophils Absolute: 0.2 10*3/uL (ref 0.0–0.5)
Eosinophils Relative: 4 %
HCT: 37.8 % (ref 36.0–46.0)
Hemoglobin: 12.5 g/dL (ref 12.0–15.0)
Immature Granulocytes: 0 %
Lymphocytes Relative: 39 %
Lymphs Abs: 1.8 10*3/uL (ref 0.7–4.0)
MCH: 28.7 pg (ref 26.0–34.0)
MCHC: 33.1 g/dL (ref 30.0–36.0)
MCV: 86.7 fL (ref 80.0–100.0)
Monocytes Absolute: 0.4 10*3/uL (ref 0.1–1.0)
Monocytes Relative: 8 %
Neutro Abs: 2.1 10*3/uL (ref 1.7–7.7)
Neutrophils Relative %: 49 %
Platelet Count: 153 10*3/uL (ref 150–400)
RBC: 4.36 MIL/uL (ref 3.87–5.11)
RDW: 14.3 % (ref 11.5–15.5)
WBC Count: 4.5 10*3/uL (ref 4.0–10.5)
nRBC: 0 % (ref 0.0–0.2)

## 2022-06-09 LAB — HEPATITIS B SURFACE ANTIBODY,QUALITATIVE: Hep B S Ab: NONREACTIVE

## 2022-06-09 LAB — IMMATURE PLATELET FRACTION: Immature Platelet Fraction: 12.2 % — ABNORMAL HIGH (ref 1.2–8.6)

## 2022-06-09 LAB — HEPATITIS B SURFACE ANTIGEN: Hepatitis B Surface Ag: NONREACTIVE

## 2022-06-09 LAB — POCT RAPID STREP A (OFFICE): Rapid Strep A Screen: NEGATIVE

## 2022-06-09 LAB — HEPATITIS B CORE ANTIBODY, TOTAL: Hep B Core Total Ab: NONREACTIVE

## 2022-06-09 LAB — SAVE SMEAR(SSMR), FOR PROVIDER SLIDE REVIEW

## 2022-06-09 LAB — FOLATE: Folate: 19.2 ng/mL (ref >5.9)

## 2022-06-09 LAB — VITAMIN B12: Vitamin B-12: 1103 pg/mL — ABNORMAL HIGH (ref 180–914)

## 2022-06-09 MED ORDER — CEFDINIR 300 MG PO CAPS: 300.0000 mg | ORAL_CAPSULE | Freq: Two times a day (BID) | ORAL | 0 refills | Status: DC

## 2022-06-09 NOTE — Discharge Instructions (Signed)
Your strep test today is negative.  Streptococcal throat culture will be performed per our protocol, please keep in mind that the rapid strep test that we perform here at urgent care only catches 40% of strep throat infections.   Based on my physical exam findings and the history you provided to me today, I recommend that you begin antibiotics now for presumed strep throat instead of waiting for the strep culture result.  I have sent a prescription to your pharmacy.  After 24 hours of antibiotics, you should begin to feel significantly better.     After 24 hours of taking antibiotics, please discard your toothbrush as well as any other oral devices that you are currently using and replace them with new ones to avoid reinfection.   If your streptococcal throat culture has a negative result but you feel significantly better after taking antibiotics for 24 to 48 hours, I strongly recommend that you finish the full 10-day course.  Bacterial culture tests are only as reliable as the laboratory technician performing them.  Alternately, if your streptococcal throat culture has a negative result and you see no improvement of your symptoms after 24 to 48 hours of antibiotics, please discontinue the antibiotics as they are no longer indicated.  Your throat infection will then be most likely considered viral and will have to resolve on its own.   Once you have been on antibiotics for a full 24 hours, you are no longer considered contagious.   I have provided you with a note to return to work.   Even if you are feeling better, please make sure that you finish the full 10-day course and do not skip any doses.  Failure to complete a full course of antibiotics for strep throat can result in worsening infection that may require longer treatment with stronger antibiotics.   Please see the list below for recommended medications, dosages and frequencies to provide relief of your current symptoms:    Omnicef (cefdinir):   1 capsule twice daily for 10 days, you can take it with or without food.  This antibiotic can cause upset stomach, this will resolve once antibiotics are complete.  You are welcome to use a probiotic, eat yogurt, take Imodium while taking this medication.  Please avoid other systemic medications such as Maalox, Pepto-Bismol or milk of magnesia as they can interfere with your body's ability to absorb the antibiotics.    Advil, Motrin (ibuprofen): This is a good anti-inflammatory medication which addresses aches, pains and inflammation of the upper airways that causes sinus and nasal congestion as well as in the lower airways which makes your cough feel tight and sometimes burn.  I recommend that you take between 400 to 600 mg every 6-8 hours as needed.      Tylenol (acetaminophen): This is a good fever reducer.  If your body temperature rises above 101.5 as measured with a thermometer, it is recommended that you take 1,000 mg every 8 hours until your temperature falls below 101.5, please not take more than 3,000 mg of acetaminophen either as a separate medication or as in ingredient in an over-the-counter cold/flu preparation within a 24-hour period.      Chloraseptic Throat Spray: Spray 5 sprays into affected area every 2 hours, hold for 15 seconds and either swallow or spit it out.  This is a excellent numbing medication because it is a spray, you can put it right where you needed and so sucking on a lozenge and numbing your  entire mouth.      Please follow-up within the next 5-7 days either with your primary care provider or urgent care if your symptoms do not resolve.  If you do not have a primary care provider, we will assist you in finding one.   Thank you for visiting urgent care today.  We appreciate the opportunity to participate in your care.

## 2022-06-09 NOTE — ED Provider Notes (Signed)
[facility]    CSN: [id number] Arrival date & time: 06/09/22  1629    HISTORY   Chief Complaint  Patient presents with   Sore Throat    white spots on tonsils - Entered by patient   HPI Katherine Parrish is a pleasant, 21 y.o. female who presents to urgent care today. Patient states that this morning she noticed that she has white spots on her tonsils which are significantly enlarged, states she is also experiencing a very sore throat pain with swallowing.  Patient denies fever, headache, nausea, body aches, chills.  Patient states she recently finished Augmentin for 10 days prescribed on May 26, 2022 for acute bacterial tonsillitis.  EMR reviewed, patient's Monospot test that day was also negative.  Rapid strep test today was negative.  The history is provided by the patient.   Past Medical History:  Diagnosis Date   Asthma    Vaginal delivery 03/15/2021   Patient Active Problem List   Diagnosis Date Noted   Vitamin D deficiency 12/14/2021   Asthma 06/03/2021   Chronic hypertension with superimposed pre-eclampsia 03/14/2021   Thrombocytopenia (HCC) 03/14/2021   Mild intermittent asthma without complication 12/13/2017   Past Surgical History:  Procedure Laterality Date   HERNIA REPAIR     OB History     Gravida  1   Para      Term      Preterm      AB      Living         SAB      IAB      Ectopic      Multiple      Live Births             Home Medications    Prior to Admission medications   Medication Sig Start Date End Date Taking? Authorizing Provider  albuterol (VENTOLIN HFA) 108 (90 Base) MCG/ACT inhaler Inhale 1-2 puffs into the lungs every 6 (six) hours as needed for wheezing or shortness of breath. 08/04/21   Jarold Motto, PA  amLODipine (NORVASC) 5 MG tablet Take 1 tablet (5 mg total) by mouth daily. 05/29/22   Tobb, Kardie, DO  hydrochlorothiazide (MICROZIDE) 12.5 MG capsule Take 1 capsule (12.5 mg total) by mouth daily.  03/31/22   Jarold Motto, PA  meclizine (ANTIVERT) 12.5 MG tablet Take 1 tablet (12.5 mg total) by mouth 3 (three) times daily as needed for dizziness. 05/18/22   Jarold Motto, PA  montelukast (SINGULAIR) 10 MG tablet Take 1 tablet (10 mg total) by mouth at bedtime. 03/31/22   Jarold Motto, PA  Semaglutide-Weight Management (WEGOVY) 0.25 MG/0.5ML SOAJ Inject 0.25 mg into the skin once a week. 04/21/22   Jarold Motto, PA  Fluticasone Propionate, Inhal, 100 MCG/ACT AEPB Inhale 1 puff into the lungs in the morning and at bedtime. 10/05/21 10/06/21  Jarold Motto, PA    Family History Family History  Problem Relation Age of Onset   Uterine cancer Mother        leiomyosarcoma   Hypertension Mother    Thyroid disease Mother    Breast cancer Maternal Grandmother    Colon cancer Neg Hx    Social History Social History   Tobacco Use   Smoking status: Never   Smokeless tobacco: Never  Vaping Use   Vaping Use: Never used  Substance Use Topics   Alcohol use: No   Drug use: No   Allergies   Aspirin  Review of Systems Review  of Systems Pertinent findings revealed after performing a 14 point review of systems has been noted in the history of present illness.  Physical Exam Triage Vital Signs ED Triage Vitals  Enc Vitals Group     BP 07/15/21 0827 (!) 147/82     Pulse Rate 07/15/21 0827 72     Resp 07/15/21 0827 18     Temp 07/15/21 0827 98.3 F (36.8 C)     Temp Source 07/15/21 0827 Oral     SpO2 07/15/21 0827 98 %     Weight --      Height --      Head Circumference --      Peak Flow --      Pain Score 07/15/21 0826 5     Pain Loc --      Pain Edu? --      Excl. in [place]? --   No data found.  Updated Vital Signs BP 118/77 (BP Location: Left Arm)   Pulse 80   Temp 98.3 F (36.8 C) (Oral)   Resp 18   SpO2 98%   Physical Exam Constitutional:      General: She is not in acute distress.    Appearance: She is well-developed. She is ill-appearing. She is not  toxic-appearing.  HENT:     Head: Normocephalic and atraumatic.     Salivary Glands: Right salivary gland is diffusely enlarged and tender. Left salivary gland is diffusely enlarged and tender.     Right Ear: Hearing and external ear normal.     Left Ear: Hearing and external ear normal.     Ears:     Comments: Bilateral EACs with mild erythema, bilateral TMs are normal    Nose: No mucosal edema, congestion or rhinorrhea.     Right Turbinates: Not enlarged, swollen or pale.     Left Turbinates: Not enlarged or swollen.     Right Sinus: No maxillary sinus tenderness or frontal sinus tenderness.     Left Sinus: No maxillary sinus tenderness or frontal sinus tenderness.     Mouth/Throat:     Lips: Pink. No lesions.     Mouth: Mucous membranes are moist. No oral lesions or angioedema.     Dentition: No gingival swelling.     Tongue: No lesions.     Palate: No mass.     Pharynx: Uvula midline. Pharyngeal swelling, oropharyngeal exudate and posterior oropharyngeal erythema present. No uvula swelling.     Tonsils: Tonsillar exudate present. 2+ on the right. 2+ on the left.  Eyes:     Extraocular Movements: Extraocular movements intact.     Conjunctiva/sclera: Conjunctivae normal.     Pupils: Pupils are equal, round, and reactive to light.  Neck:     Thyroid: No thyroid mass, thyromegaly or thyroid tenderness.     Trachea: Tracheal tenderness present. No abnormal tracheal secretions or tracheal deviation.     Comments: Voice is muffled Cardiovascular:     Rate and Rhythm: Normal rate and regular rhythm.     Pulses: Normal pulses.     Heart sounds: Normal heart sounds, S1 normal and S2 normal. No murmur heard.    No friction rub. No gallop.  Pulmonary:     Effort: Pulmonary effort is normal. No accessory muscle usage, prolonged expiration, respiratory distress or retractions.     Breath sounds: No stridor, decreased air movement or transmitted upper airway sounds. No decreased breath  sounds, wheezing, rhonchi or rales.  Abdominal:  General: Bowel sounds are normal.     Palpations: Abdomen is soft.     Tenderness: There is generalized abdominal tenderness. There is no right CVA tenderness, left CVA tenderness or rebound. Negative signs include Murphy's sign.     Hernia: No hernia is present.  Musculoskeletal:        General: No tenderness. Normal range of motion.     Cervical back: Full passive range of motion without pain, normal range of motion and neck supple.     Right lower leg: No edema.     Left lower leg: No edema.  Lymphadenopathy:     Cervical: Cervical adenopathy present.     Right cervical: Superficial cervical adenopathy present.     Left cervical: Superficial cervical adenopathy present.  Skin:    General: Skin is warm and dry.     Findings: No erythema, lesion or rash.  Neurological:     General: No focal deficit present.     Mental Status: She is alert and oriented to person, place, and time. Mental status is at baseline.  Psychiatric:        Mood and Affect: Mood normal.        Behavior: Behavior normal.        Thought Content: Thought content normal.        Judgment: Judgment normal.     Visual Acuity Right Eye Distance:   Left Eye Distance:   Bilateral Distance:    Right Eye Near:   Left Eye Near:    Bilateral Near:     [facility] Couse / Diagnostics / Procedures:     Radiology No results found.  Procedures Procedures (including critical care time) EKG  Pending results:  Labs Reviewed  CULTURE, GROUP A STREP [facility])  POCT RAPID STREP A (OFFICE)    Medications Ordered in UC: Medications - No data to display  UC Diagnoses / Final Clinical Impressions(s)   I have reviewed the triage vital signs and the nursing notes.  Pertinent labs & imaging results that were available during my care of the patient were reviewed by me and considered in my medical decision making (see chart for details).    Final diagnoses:  Acute bacterial  tonsillitis   Patient recently finished Augmentin for presumed bacterial tonsillitis.  Repeat monotest not indicated.  Patient denies possibility of oral STD exposure.  We will treat patient with cefdinir for broader spectrum coverage.  Patient advised to follow-up with ENT as has previously been recommended.  Return precautions advised.  ED Prescriptions     Medication Sig Dispense Auth. Provider   cefdinir (OMNICEF) 300 MG capsule Take 1 capsule (300 mg total) by mouth 2 (two) times daily for 10 days. 20 capsule Theadora Rama Scales, PA-C      PDMP not reviewed this encounter.  Disposition Upon Discharge:  Condition: stable for discharge home Home: take medications as prescribed; routine discharge instructions as discussed; follow up as advised.  Patient presented with an acute illness with associated systemic symptoms and significant discomfort requiring urgent management. In my opinion, this is a condition that a prudent lay person (someone who possesses an average knowledge of health and medicine) may potentially expect to result in complications if not addressed urgently such as respiratory distress, impairment of bodily function or dysfunction of bodily organs.   Routine symptom specific, illness specific and/or disease specific instructions were discussed with the patient and/or caregiver at length.   As such, the patient has been evaluated and assessed,  work-up was performed and treatment was provided in alignment with urgent care protocols and evidence based medicine.  Patient/parent/caregiver has been advised that the patient may require follow up for further testing and treatment if the symptoms continue in spite of treatment, as clinically indicated and appropriate.  If the patient was tested for COVID-19, Influenza and/or RSV, then the patient/parent/guardian was advised to isolate at home pending the results of his/her diagnostic coronavirus test and potentially longer if  they're positive. I have also advised pt that if his/her COVID-19 test returns positive, it's recommended to self-isolate for at least 10 days after symptoms first appeared AND until fever-free for 24 hours without fever reducer AND other symptoms have improved or resolved. Discussed self-isolation recommendations as well as instructions for household member/close contacts as per the [facility] and [place] DHHS, and also gave patient the COVID packet with this information.  Patient/parent/caregiver has been advised [facility]CP in 3-5 days if no better; to PCP or the Emergency Department if new signs and symptoms develop, or if the current signs or symptoms continue to change or worsen for further workup, evaluation and treatment as clinically indicated and appropriate  The patient will follow up with their current PCP if and as advised. If the patient does not currently have a PCP we will assist them in obtaining one.   The patient may need specialty follow up if the symptoms continue, in spite of conservative treatment and management, for further workup, evaluation, consultation and treatment as clinically indicated and appropriate.  Patient/parent/caregiver verbalized understanding and agreement of plan as discussed.  All questions were addressed during visit.  Please see discharge instructions below for further details of plan.  Discharge Instructions:   Discharge Instructions      Your strep test today is negative.  Streptococcal throat culture will be performed per our protocol, please keep in mind that the rapid strep test that we perform here at urgent care only catches 40% of strep throat infections.   Based on my physical exam findings and the history you provided to me today, I recommend that you begin antibiotics now for presumed strep throat instead of waiting for the strep culture result.  I have sent a prescription to your pharmacy.  After 24 hours of antibiotics, you should begin  to feel significantly better.     After 24 hours of taking antibiotics, please discard your toothbrush as well as any other oral devices that you are currently using and replace them with new ones to avoid reinfection.   If your streptococcal throat culture has a negative result but you feel significantly better after taking antibiotics for 24 to 48 hours, I strongly recommend that you finish the full 10-day course.  Bacterial culture tests are only as reliable as the laboratory technician performing them.  Alternately, if your streptococcal throat culture has a negative result and you see no improvement of your symptoms after 24 to 48 hours of antibiotics, please discontinue the antibiotics as they are no longer indicated.  Your throat infection will then be most likely considered viral and will have to resolve on its own.   Once you have been on antibiotics for a full 24 hours, you are no longer considered contagious.   I have provided you with a note to return to work.   Even if you are feeling better, please make sure that you finish the full 10-day course and do not skip any doses.  Failure to  complete a full course of antibiotics for strep throat can result in worsening infection that may require longer treatment with stronger antibiotics.   Please see the list below for recommended medications, dosages and frequencies to provide relief of your current symptoms:    Omnicef (cefdinir):  1 capsule twice daily for 10 days, you can take it with or without food.  This antibiotic can cause upset stomach, this will resolve once antibiotics are complete.  You are welcome to use a probiotic, eat yogurt, take Imodium while taking this medication.  Please avoid other systemic medications such as Maalox, Pepto-Bismol or milk of magnesia as they can interfere with your body's ability to absorb the antibiotics.    Advil, Motrin (ibuprofen): This is a good anti-inflammatory medication which addresses aches,  pains and inflammation of the upper airways that causes sinus and nasal congestion as well as in the lower airways which makes your cough feel tight and sometimes burn.  I recommend that you take between 400 to 600 mg every 6-8 hours as needed.      Tylenol (acetaminophen): This is a good fever reducer.  If your body temperature rises above 101.5 as measured with a thermometer, it is recommended that you take 1,000 mg every 8 hours until your temperature falls below 101.5, please not take more than 3,000 mg of acetaminophen either as a separate medication or as in ingredient in an over-the-counter cold/flu preparation within a 24-hour period.      Chloraseptic Throat Spray: Spray 5 sprays into affected area every 2 hours, hold for 15 seconds and either swallow or spit it out.  This is a excellent numbing medication because it is a spray, you can put it right where you needed and so sucking on a lozenge and numbing your entire mouth.      Please follow-up within the next 5-7 days either with your primary care provider or urgent care if your symptoms do not resolve.  If you do not have a primary care provider, we will assist you in finding one.   Thank you for visiting urgent care today.  We appreciate the opportunity to participate in your care.       This office note has been dictated using Teaching laboratory technician.  Unfortunately, this method of dictation can sometimes lead to typographical or grammatical errors.  I apologize for your inconvenience in advance if this occurs.  Please do not hesitate to reach out to me if clarification is needed.      Theadora Rama Scales, PA-C 06/09/22 1756

## 2022-06-09 NOTE — ED Triage Notes (Signed)
Pt c/o sore throat and saw white spots on her tonsils.   The patient recently finished an abx.   Started: today

## 2022-06-11 LAB — METHYLMALONIC ACID, SERUM: Methylmalonic Acid, Quantitative: 109 nmol/L (ref 0–378)

## 2022-06-12 LAB — CULTURE, GROUP A STREP (THRC)

## 2022-06-13 LAB — ECHOCARDIOGRAM COMPLETE
Area-P 1/2: 3.27 cm2
Height: 67 in
S' Lateral: 2.9 cm
Weight: 3462.4 oz

## 2022-06-14 ENCOUNTER — Telehealth: Payer: Self-pay | Admitting: Physician Assistant

## 2022-06-14 NOTE — Telephone Encounter (Signed)
I spoke to Ms. Katherine Parrish to review the lab results form 06/09/2022.  Findings show that platelet counts are back to normal.  Remaining work-up shows no evidence of hepatitis B infection or additional deficiencies.  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  Since platelet counts are back to normal, no further work-up is required at this time.  Okay to monitor platelet counts with PCP and return to the clinic if platelet counts start to decline in the future.  Patient expressed understanding and satisfaction with the plan provided.

## 2022-06-16 ENCOUNTER — Ambulatory Visit (INDEPENDENT_AMBULATORY_CARE_PROVIDER_SITE_OTHER): Payer: No Typology Code available for payment source | Admitting: Physician Assistant

## 2022-06-16 ENCOUNTER — Encounter: Payer: Self-pay | Admitting: Physician Assistant

## 2022-06-16 ENCOUNTER — Other Ambulatory Visit (HOSPITAL_COMMUNITY): Payer: Self-pay

## 2022-06-16 ENCOUNTER — Ambulatory Visit: Payer: No Typology Code available for payment source | Admitting: Physician Assistant

## 2022-06-16 VITALS — BP 118/70 | HR 86 | Temp 98.0°F | Ht 67.0 in | Wt 213.0 lb

## 2022-06-16 DIAGNOSIS — E669 Obesity, unspecified: Secondary | ICD-10-CM

## 2022-06-16 DIAGNOSIS — F5101 Primary insomnia: Secondary | ICD-10-CM | POA: Diagnosis not present

## 2022-06-16 DIAGNOSIS — I10 Essential (primary) hypertension: Secondary | ICD-10-CM | POA: Diagnosis not present

## 2022-06-16 MED ORDER — WEGOVY 0.5 MG/0.5ML ~~LOC~~ SOAJ
0.5000 mg | SUBCUTANEOUS | 0 refills | Status: DC
  Filled 2022-06-16: qty 2, 28d supply, fill #0

## 2022-06-16 MED ORDER — TRAZODONE HCL 50 MG PO TABS: 25.0000 mg | ORAL_TABLET | Freq: Every evening | ORAL | 1 refills | Status: DC | PRN

## 2022-06-16 NOTE — Progress Notes (Signed)
Katherine Parrish is a 21 y.o. female here for a follow up of a pre-existing problem.  History of Present Illness:   Chief Complaint  Patient presents with   Hypertension    Pt has been checking blood pressure averaging 694'W systolic and 54-62 diastolic.      HTN Currently taking amlodopine 5 mg daily and HCTZ 12.5 mg daily. At home blood pressure readings are: WNL. Patient denies chest pain, SOB, blurred vision, dizziness, unusual headaches, lower leg swelling. Patient is compliant with medication. Denies excessive caffeine intake, stimulant usage, excessive alcohol intake, or increase in salt consumption.  BP Readings from Last 3 Encounters:  06/16/22 118/70  06/09/22 118/77  06/09/22 129/77   She had an echo done recently and this was overall WNL.  Insomnia Has had issues with falling asleep and staying asleep. Has tried trazodone in the past and this has been successful for her.  She would like to restart this.  She has been working on sleep hygiene.  Obesity She is currently taking Wegovy 0.25 mg weekly.  She has been taking this for 2 weeks.  She has not noticed any side effects.  She has lost at least 3 lb since start this.    Past Medical History:  Diagnosis Date   Asthma    Vaginal delivery 03/15/2021     Social History   Tobacco Use   Smoking status: Never   Smokeless tobacco: Never  Vaping Use   Vaping Use: Never used  Substance Use Topics   Alcohol use: No   Drug use: No    Past Surgical History:  Procedure Laterality Date   HERNIA REPAIR      Family History  Problem Relation Age of Onset   Uterine cancer Mother        leiomyosarcoma   Hypertension Mother    Thyroid disease Mother    Breast cancer Maternal Grandmother    Colon cancer Neg Hx     Allergies  Allergen Reactions   Aspirin Nausea Only    Current Medications:   Current Outpatient Medications:    albuterol (VENTOLIN HFA) 108 (90 Base) MCG/ACT inhaler, Inhale 1-2 puffs into the  lungs every 6 (six) hours as needed for wheezing or shortness of breath., Disp: 18 g, Rfl: 1   amLODipine (NORVASC) 5 MG tablet, Take 1 tablet (5 mg total) by mouth daily., Disp: 90 tablet, Rfl: 3   hydrochlorothiazide (MICROZIDE) 12.5 MG capsule, Take 1 capsule (12.5 mg total) by mouth daily., Disp: 90 capsule, Rfl: 1   meclizine (ANTIVERT) 12.5 MG tablet, Take 1 tablet (12.5 mg total) by mouth 3 (three) times daily as needed for dizziness., Disp: 30 tablet, Rfl: 0   montelukast (SINGULAIR) 10 MG tablet, Take 1 tablet (10 mg total) by mouth at bedtime., Disp: 90 tablet, Rfl: 3   Semaglutide-Weight Management (WEGOVY) 0.25 MG/0.5ML SOAJ, Inject 0.25 mg into the skin once a week., Disp: 2 mL, Rfl: 0   cefdinir (OMNICEF) 300 MG capsule, Take 1 capsule (300 mg total) by mouth 2 (two) times daily for 10 days. (Patient not taking: Reported on 06/16/2022), Disp: 20 capsule, Rfl: 0   Review of Systems:   ROS Negative unless otherwise specified per HPI.  Vitals:   Vitals:   06/16/22 1100  BP: 118/70  Pulse: 86  Temp: 98 F (36.7 C)  TempSrc: Temporal  SpO2: 96%  Weight: 213 lb (96.6 kg)  Height: 5\' 7"  (1.702 m)     Body mass index is  33.36 kg/m.  Physical Exam:   Physical Exam Vitals and nursing note reviewed.  Constitutional:      General: She is not in acute distress.    Appearance: She is well-developed. She is not ill-appearing or toxic-appearing.  Cardiovascular:     Rate and Rhythm: Normal rate and regular rhythm.     Pulses: Normal pulses.     Heart sounds: Normal heart sounds, S1 normal and S2 normal.  Pulmonary:     Effort: Pulmonary effort is normal.     Breath sounds: Normal breath sounds.  Skin:    General: Skin is warm and dry.  Neurological:     Mental Status: She is alert.     GCS: GCS eye subscore is 4. GCS verbal subscore is 5. GCS motor subscore is 6.  Psychiatric:        Speech: Speech normal.        Behavior: Behavior normal. Behavior is cooperative.      Assessment and Plan:   Primary hypertension Normotensive Continue amlodipine 5 mg daily and hydrochlorothiazide 12.5 mg daily Follow-up with [place] or cardiology in 6 months, sooner if concerns  Primary insomnia Uncontrolled Start trazodone 25-50 mg nightly as needed for sleep Follow-up in 6 months, sooner if concerns  Obesity, unspecified classification, unspecified obesity type, unspecified whether serious comorbidity present Improving Continue current 0.25 mg Wegovy weekly I have sent the next dose of 0.5 mg Wegovy weekly We can continue to increase this as tolerated as soon as each month Follow-up in 6 months, sooner if concerns    Jarold Motto, PA-C

## 2022-06-23 ENCOUNTER — Encounter: Payer: Self-pay | Admitting: Physician Assistant

## 2022-06-23 ENCOUNTER — Other Ambulatory Visit (HOSPITAL_COMMUNITY): Payer: Self-pay

## 2022-06-29 ENCOUNTER — Other Ambulatory Visit (HOSPITAL_COMMUNITY): Payer: Self-pay

## 2022-07-04 NOTE — Progress Notes (Signed)
REFERRING PROVIDER: Inda Coke, [place],  [place] [address]  PRIMARY PROVIDER:  Inda Coke, [place]  PRIMARY REASON FOR VISIT:  Encounter Diagnoses  Name Primary?   Family history of breast cancer Yes   Family history of uterine cancer     HISTORY OF PRESENT ILLNESS:   Katherine Parrish, a 21 y.o. female, was seen for a [facility] cancer genetics consultation at the request of Inda Coke, PA-C due to a family history of breast and uterine cancer.  Katherine Parrish presents to clinic today to discuss the possibility of a hereditary predisposition to cancer, to discuss genetic testing, and to further clarify her future cancer risks, as well as potential cancer risks for family members.   Katherine Parrish is a 21 y.o. female with no personal history of cancer.    CANCER HISTORY:  Oncology History   No history exists.    RISK FACTORS:  Mammogram within the last year: no Number of breast biopsies: 0. Colonoscopy: no; not examined. Hysterectomy: no.  Ovaries intact: yes.  Menarche was at age 82 or 18.  First live birth at age 31.  Menopausal status: premenopausal.  OCP use for approximately 2 years.  No dermatology screening.   Past Medical History:  Diagnosis Date   Asthma    Vaginal delivery 03/15/2021    Past Surgical History:  Procedure Laterality Date   HERNIA REPAIR       FAMILY HISTORY:  We obtained a detailed, 4-generation family history.  Significant diagnoses are listed below: Family History  Problem Relation Age of Onset   Uterine cancer Mother 22       leiomyosarcoma; d. 22   Breast cancer Maternal Grandmother        dx 30s/40s   Cancer Paternal Grandfather        dx 23s; unknown primary; ? lung      Katherine Parrish is unaware of previous family history of genetic testing for hereditary cancer risks. There is no reported Ashkenazi Jewish ancestry. There is no known consanguinity.  GENETIC COUNSELING ASSESSMENT: Katherine Parrish is a 21 y.o.  female with a family history of cancer which is somewhat suggestive of a hereditary cancer syndrome and predisposition to cancer given the ages of diagnosis in the family. We, therefore, discussed and recommended the following at today's visit.   DISCUSSION: We discussed that 5 - 10% of cancer is hereditary, with most cases of hereditary uterine cancer associated with Lynch syndrome and most cases of hereditary breast cancer associated with mutations in BRCA1/2.  There are other genes that can be associated with hereditary breast cancer syndromes.  We discussed that testing is beneficial for several reasons, including knowing about other cancer risks, identifying potential screening and risk-reduction options that may be appropriate, and to understanding if other family members could be at risk for cancer and allowing them to undergo genetic testing.  We reviewed the characteristics, features and inheritance patterns of hereditary cancer syndromes. We also discussed genetic testing, including the appropriate family members to test, the process of testing, insurance coverage and turn-around-time for results. We discussed the implications of a negative, positive, carrier and/or variant of uncertain significant result. We discussed that negative results would be uninformative given that [name] does not have a personal history of cancer. We recommended Katherine Parrish pursue genetic testing for a panel that contains genes associated with breast and uterine cancer.  Katherine Parrish was offered a common hereditary cancer panel (47 genes) and an  expanded pan-cancer panel (77 genes). Katherine Parrish was informed of the benefits and limitations of each panel, including that expanded pan-cancer panels contain several genes that do not have clear management guidelines at this point in time.  We also discussed that as the number of genes included on a panel increases, the chances of variants of uncertain significance increases.   After considering the benefits and limitations of each gene panel, Katherine Parrish elected to have a common hereditary cancers panel through [facility].   Based on Katherine Parrish's family history of breast and uterine cancer before age 20, she meets medical criteria for genetic testing. Despite that she meets criteria, she may still have an out of pocket cost. We discussed that if her out of pocket cost for testing is over $100, the laboratory should contact them to discuss self-pay options and/or patient pay assistance programs.   We discussed that some people do not want to undergo genetic testing due to fear of genetic discrimination.  A federal law called the Genetic Information Non-Discrimination Act (GINA) of 2008 helps protect individuals against genetic discrimination based on their genetic test results.  It impacts both health insurance and employment.  With health insurance, it protects against increased premiums, being kicked off insurance or being forced to take a test in order to be insured.  For employment it protects against hiring, firing and promoting decisions based on genetic test results.  GINA does not apply to those in the [employer], those who work for companies with less than 15 employees, and new life insurance or long-term disability insurance policies.  Health status due to a cancer diagnosis is not protected under GINA.  PLAN: After considering the risks, benefits, and limitations, Katherine Parrish provided informed consent to pursue genetic testing and the blood sample was sent to [facility] for analysis of the CustomNext-Cancer +RNAinsight Panel. Results should be available within approximately 3 weeks' time, at which point they will be disclosed by telephone to Ms. Hoh, as will any additional recommendations warranted by these results. Katherine Parrish will receive a summary of her genetic counseling visit and a copy of her results once available. This information will also be available  in Epic.    Katherine Parrish questions were answered to her satisfaction today. Our contact information was provided should additional questions or concerns arise. Thank you for the referral and allowing [place] to share in the care of your patient.   Teesha Ohm M. Joette Catching, [place], [facility] Genetic Counselor [email or web address] (P) [phone]   The patient was seen for a total of 30 minutes in face-to-face genetic counseling.  The patient was seen alone.  Drs. Lindi Adie and/or Burr Medico were available to discuss this case as needed.  _______________________________________________________________________ For Office Staff:  Number of people involved in session: 1 Was an Intern/ student involved with case: yes; prospective GC student Rolla Etienne observed session

## 2022-07-05 ENCOUNTER — Other Ambulatory Visit (HOSPITAL_COMMUNITY): Payer: Self-pay

## 2022-07-06 ENCOUNTER — Other Ambulatory Visit: Payer: Self-pay

## 2022-07-06 ENCOUNTER — Inpatient Hospital Stay: Payer: No Typology Code available for payment source

## 2022-07-06 ENCOUNTER — Other Ambulatory Visit: Payer: Self-pay | Admitting: Genetic Counselor

## 2022-07-06 ENCOUNTER — Inpatient Hospital Stay: Payer: No Typology Code available for payment source | Attending: Physician Assistant | Admitting: Genetic Counselor

## 2022-07-06 DIAGNOSIS — Z803 Family history of malignant neoplasm of breast: Secondary | ICD-10-CM | POA: Diagnosis not present

## 2022-07-06 DIAGNOSIS — Z8049 Family history of malignant neoplasm of other genital organs: Secondary | ICD-10-CM | POA: Diagnosis not present

## 2022-07-06 LAB — GENETIC SCREENING ORDER

## 2022-07-07 ENCOUNTER — Encounter: Payer: Self-pay | Admitting: Genetic Counselor

## 2022-07-27 ENCOUNTER — Encounter: Payer: Self-pay | Admitting: Physician Assistant

## 2022-08-01 ENCOUNTER — Other Ambulatory Visit (HOSPITAL_COMMUNITY): Payer: Self-pay

## 2022-08-01 MED ORDER — SERTRALINE HCL 100 MG PO TABS
100.0000 mg | ORAL_TABLET | Freq: Every day | ORAL | 1 refills | Status: DC
  Filled 2022-08-01 – 2022-08-27 (×2): qty 90, 90d supply, fill #0

## 2022-08-01 MED ORDER — SERTRALINE HCL 100 MG PO TABS: 100.0000 mg | ORAL_TABLET | Freq: Every day | ORAL | 1 refills | Status: DC

## 2022-08-01 NOTE — Addendum Note (Signed)
Addended by: Jimmye Norman on: 08/01/2022 04:32 PM   Modules accepted: Orders

## 2022-08-01 NOTE — Telephone Encounter (Signed)
Called [facility] and spoke to Vernona Rieger told her to cancel Rx for Zoloft sent to wrong pharmacy. Vernona Rieger Verbalized understanding.

## 2022-08-02 ENCOUNTER — Other Ambulatory Visit (HOSPITAL_COMMUNITY): Payer: Self-pay

## 2022-08-04 ENCOUNTER — Encounter: Payer: Self-pay | Admitting: Genetic Counselor

## 2022-08-04 ENCOUNTER — Ambulatory Visit: Payer: Self-pay | Admitting: Genetic Counselor

## 2022-08-04 ENCOUNTER — Telehealth: Payer: Self-pay | Admitting: Genetic Counselor

## 2022-08-04 DIAGNOSIS — Z1379 Encounter for other screening for genetic and chromosomal anomalies: Secondary | ICD-10-CM

## 2022-08-04 DIAGNOSIS — Z8049 Family history of malignant neoplasm of other genital organs: Secondary | ICD-10-CM

## 2022-08-04 DIAGNOSIS — Z803 Family history of malignant neoplasm of breast: Secondary | ICD-10-CM

## 2022-08-04 NOTE — Telephone Encounter (Addendum)
Revealed negative genetics.   

## 2022-08-11 ENCOUNTER — Other Ambulatory Visit (HOSPITAL_COMMUNITY): Payer: Self-pay

## 2022-08-16 NOTE — Progress Notes (Signed)
HPI:   Ms. Ang was previously seen in the [place] clinic due to a family history of breast and uterine and concerns regarding a hereditary predisposition to cancer. Please refer to our prior cancer genetics clinic note for more information regarding our discussion, assessment and recommendations, at the time. Ms. Martinek recent genetic test results were disclosed to her, as were recommendations warranted by these results. These results and recommendations are discussed in more detail below.  CANCER HISTORY:  Ms. Hayes is a 21 y.o. female with no personal history of cancer   FAMILY HISTORY:  We obtained a detailed, 4-generation family history.  Significant diagnoses are listed below:      Family History  Problem Relation Age of Onset   Uterine cancer Mother 48        leiomyosarcoma; d. 55   Breast cancer Maternal Grandmother          dx 30s/40s   Cancer Paternal Grandfather          dx 67s; unknown primary; ? lung         Ms. Kiesler is unaware of previous family history of genetic testing for hereditary cancer risks. There is no reported Ashkenazi Jewish ancestry. There is no known consanguinity.    GENETIC TEST RESULTS:  The [facility] CustomNext-Cancer +RNAinsight Panel found no pathogenic mutations.   The CustomNext-Cancer+RNAinsight panel offered by [facility] includes sequencing and rearrangement analysis for the following 47 genes:  APC, ATM, AXIN2, BARD1, BMPR1A, BRCA1, BRCA2, BRIP1, CDH1, CDK4, CDKN2A, CHEK2, DICER1, EPCAM, GREM1, HOXB13, MEN1, MLH1, MSH2, MSH3, MSH6, MUTYH, NBN, NF1, NF2, NTHL1, PALB2, PMS2, POLD1, POLE, PTEN, RAD51C, RAD51D, RECQL, RET, SDHA, SDHAF2, SDHB, SDHC, SDHD, SMAD4, SMARCA4, STK11, TP53, TSC1, TSC2, and VHL.  RNA data is routinely analyzed for use in variant interpretation for all genes.    The test report has been scanned into EPIC and is located under the Molecular Pathology section of the Results Review tab.  A portion of the  result report is included below for reference. Genetic testing reported out on July 29, 2022.      Even though a pathogenic variant was not identified, possible explanations for the cancer in the family may include: There may be no hereditary risk for cancer in the family. The cancers in Ms. Heathcock's family may be sporadic/familial or due to other genetic and environmental factors. There may be a gene mutation in one of these genes that current testing methods cannot detect but that chance is small. There could be another gene that has not yet been discovered, or that we have not yet tested, that is responsible for the cancer diagnoses in the family.  It is also possible there is a hereditary cause for the cancer in the family that Ms. Brau did not inherit.   Therefore, it is important to remain in touch with cancer genetics in the future so that we can continue to offer Ms. Walstad the most up to date genetic testing.    ADDITIONAL GENETIC TESTING:  We discussed with Ms. Seifried that her genetic testing was fairly extensive.  If there are additional relevant genes identified to increase cancer risk that can be analyzed in the future, we would be happy to discuss and coordinate this testing at that time.     CANCER SCREENING RECOMMENDATIONS:  Ms. Choma test result is considered negative (normal).  This means that we have not identified a hereditary cause for her family history of cancer at this  time.   An individual's cancer risk and medical management are not determined by genetic test results alone. Overall cancer risk assessment incorporates additional factors, including personal medical history, family history, and any available genetic information that may result in a personalized plan for cancer prevention and surveillance. Therefore, it is recommended she continue to follow the cancer management and screening guidelines provided by her primary healthcare provider.  As she  approaches her mid/late 45s, a breast cancer risk assessment based on family history and hormonal risk factors (such as Harriett Rush) should be conducted to determine appropriate breast imaging.  RECOMMENDATIONS FOR FAMILY MEMBERS:   Since she did not inherit a identifiable mutation in a cancer predisposition gene included on this panel, her children could not have inherited a known mutation from her in one of these genes. Individuals in this family might be at some increased risk of developing cancer, over the general population risk, due to the family history of cancer.  Individuals in the family should notify their providers of the family history of cancer. We recommend women in this family have a yearly mammogram beginning at age 54, or 22 years younger than the earliest onset of cancer, an annual clinical breast exam, and perform monthly breast self-exams.   Other members of the family may still carry a pathogenic variant in one of these genes that Ms. Dellis did not inherit. Based on the family history of her deceased grandmother with breast cancer before age 19, we recommend her maternal uncle and maternal half brothers have genetic counseling and testing. Ms. Oleksy can let us know if we can be of any assistance in coordinating genetic counseling and/or testing for these family members.     FOLLOW-UP:  Lastly, we discussed with Ms. Crosby that cancer genetics is a rapidly advancing field and it is possible that new genetic tests will be appropriate for her and/or her family members in the future. We encouraged her to remain in contact with cancer genetics on an annual basis so we can update her personal and family histories and let her know of advances in cancer genetics that may benefit this family.   Our contact number was provided. Ms. Priego questions were answered to her satisfaction, and she knows she is welcome to call us at anytime with additional questions or concerns.   Kynslee Baham M.  Joette Catching, [place], [facility] Genetic Counselor [email or web address] (P) [phone]

## 2022-08-27 ENCOUNTER — Other Ambulatory Visit (HOSPITAL_COMMUNITY): Payer: Self-pay

## 2022-08-28 ENCOUNTER — Other Ambulatory Visit (HOSPITAL_COMMUNITY): Payer: Self-pay

## 2022-09-04 ENCOUNTER — Encounter: Payer: Self-pay | Admitting: Physician Assistant

## 2022-09-05 ENCOUNTER — Other Ambulatory Visit (HOSPITAL_COMMUNITY): Payer: Self-pay

## 2022-09-05 MED ORDER — FLUTICASONE PROPIONATE 50 MCG/ACT NA SUSP
2.0000 | Freq: Every day | NASAL | 2 refills | Status: DC
  Filled 2022-09-05: qty 16, 30d supply, fill #0

## 2022-09-21 ENCOUNTER — Encounter: Payer: Self-pay | Admitting: Physician Assistant

## 2022-09-22 ENCOUNTER — Other Ambulatory Visit (HOSPITAL_COMMUNITY): Payer: Self-pay

## 2022-09-22 ENCOUNTER — Ambulatory Visit (INDEPENDENT_AMBULATORY_CARE_PROVIDER_SITE_OTHER): Payer: 59 | Admitting: Internal Medicine

## 2022-09-22 ENCOUNTER — Encounter: Payer: Self-pay | Admitting: Internal Medicine

## 2022-09-22 VITALS — BP 120/76 | HR 72 | Temp 97.7°F | Ht 67.0 in | Wt 211.0 lb

## 2022-09-22 DIAGNOSIS — M545 Low back pain, unspecified: Secondary | ICD-10-CM | POA: Diagnosis not present

## 2022-09-22 MED ORDER — WEGOVY 0.25 MG/0.5ML ~~LOC~~ SOAJ
0.2500 mg | SUBCUTANEOUS | 0 refills | Status: DC
  Filled 2022-09-22 – 2022-10-02 (×2): qty 2, 28d supply, fill #0

## 2022-09-22 MED ORDER — TRAZODONE HCL 50 MG PO TABS
25.0000 mg | ORAL_TABLET | Freq: Every evening | ORAL | 0 refills | Status: DC | PRN
  Filled 2022-09-22: qty 90, 90d supply, fill #0

## 2022-09-22 MED ORDER — TIZANIDINE HCL 2 MG PO TABS
2.0000 mg | ORAL_TABLET | Freq: Every evening | ORAL | 1 refills | Status: DC | PRN
  Filled 2022-09-22: qty 30, 15d supply, fill #0

## 2022-09-22 MED ORDER — MELOXICAM 15 MG PO TABS
15.0000 mg | ORAL_TABLET | Freq: Every day | ORAL | 1 refills | Status: DC
  Filled 2022-09-22: qty 30, 30d supply, fill #0

## 2022-09-22 NOTE — Assessment & Plan Note (Addendum)
Likely muscular No worrisome findings (history or exam) Discussed heat, topical diclofenac Meloxicam trial for 2 weeks (15mg ) Discussed core and general strengthening Tizanidine prn for sleep If no improvement in 2 weeks or so, would check lumbar x-ray Discussed back hygiene with holding/lifting toddler

## 2022-09-22 NOTE — Progress Notes (Signed)
Subjective:    Patient ID: Katherine Parrish, female    DOB: 04/09/2001, 22 y.o.   MRN: [id number]  HPI Here due to back pain  Having low back pain Off and on since delivering son over a year ago (VD---no back labor or epidural) By tailbone--then across to sides---and up back some Worst time with change in position--like getting up from sitting  Yesterday was bad---sitting prolonged and it got real bad  Tried heating pad--slight help Aleve 220---not really helping Tylenol no help  Some radiation to legs--when getting up from sitting No leg weakness  Current Outpatient Medications on File Prior to Visit  Medication Sig Dispense Refill   albuterol (VENTOLIN HFA) 108 (90 Base) MCG/ACT inhaler Inhale 1-2 puffs into the lungs every 6 (six) hours as needed for wheezing or shortness of breath. 18 g 1   amLODipine (NORVASC) 5 MG tablet Take 1 tablet (5 mg total) by mouth daily. 90 tablet 3   fluticasone (FLONASE) 50 MCG/ACT nasal spray Place 2 sprays into both nostrils daily. 16 g 2   hydrochlorothiazide (MICROZIDE) 12.5 MG capsule Take 1 capsule (12.5 mg total) by mouth daily. 90 capsule 1   meclizine (ANTIVERT) 12.5 MG tablet Take 1 tablet (12.5 mg total) by mouth 3 (three) times daily as needed for dizziness. 30 tablet 0   montelukast (SINGULAIR) 10 MG tablet Take 1 tablet (10 mg total) by mouth at bedtime. 90 tablet 3   Semaglutide-Weight Management (WEGOVY) 0.25 MG/0.5ML SOAJ Inject 0.25 mg into the skin once a week. 2 mL 0   sertraline (ZOLOFT) 100 MG tablet Take 1 tablet (100 mg total) by mouth daily. 90 tablet 1   traZODone (DESYREL) 50 MG tablet Take 0.5-1 tablets (25-50 mg total) by mouth at bedtime as needed for sleep. 90 tablet 0   [DISCONTINUED] Fluticasone Propionate, Inhal, 100 MCG/ACT AEPB Inhale 1 puff into the lungs in the morning and at bedtime. 60 each 1   No current facility-administered medications on file prior to visit.    Allergies  Allergen Reactions   Aspirin  Nausea Only    Past Medical History:  Diagnosis Date   Asthma    Vaginal delivery 03/15/2021    Past Surgical History:  Procedure Laterality Date   HERNIA REPAIR      Family History  Problem Relation Age of Onset   Uterine cancer Mother 54       leiomyosarcoma; d. 105   Hypertension Mother    Thyroid disease Mother    Breast cancer Maternal Grandmother        dx 30s/40s   Cancer Paternal Grandfather        dx 73s; unknown primary; ? 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  Musculoskeletal:     Comments: Mild tenderness over the right paraspinal area Slight coccyx tenderness No sig spine tenderness SLR negative Normal hip ROM  Neurological:     Mental Status: She is alert.     Comments: Normal gait No leg weakness            Assessment & Plan:

## 2022-09-27 ENCOUNTER — Other Ambulatory Visit: Payer: Self-pay | Admitting: Physician Assistant

## 2022-10-02 ENCOUNTER — Other Ambulatory Visit (HOSPITAL_COMMUNITY): Payer: Self-pay

## 2022-10-04 ENCOUNTER — Other Ambulatory Visit (HOSPITAL_BASED_OUTPATIENT_CLINIC_OR_DEPARTMENT_OTHER): Payer: Self-pay

## 2022-10-05 ENCOUNTER — Ambulatory Visit: Payer: 59 | Admitting: Physician Assistant

## 2022-10-31 ENCOUNTER — Encounter: Payer: Self-pay | Admitting: Physician Assistant

## 2022-11-02 ENCOUNTER — Other Ambulatory Visit: Payer: Self-pay | Admitting: Physician Assistant

## 2022-11-02 DIAGNOSIS — D696 Thrombocytopenia, unspecified: Secondary | ICD-10-CM

## 2022-11-15 ENCOUNTER — Encounter: Payer: Self-pay | Admitting: Physician Assistant

## 2022-11-15 DIAGNOSIS — Z113 Encounter for screening for infections with a predominantly sexual mode of transmission: Secondary | ICD-10-CM | POA: Diagnosis not present

## 2022-11-15 DIAGNOSIS — Z3202 Encounter for pregnancy test, result negative: Secondary | ICD-10-CM | POA: Diagnosis not present

## 2022-11-15 DIAGNOSIS — Z01419 Encounter for gynecological examination (general) (routine) without abnormal findings: Secondary | ICD-10-CM | POA: Diagnosis not present

## 2022-11-16 ENCOUNTER — Ambulatory Visit: Payer: 59

## 2022-11-16 ENCOUNTER — Other Ambulatory Visit (HOSPITAL_COMMUNITY): Payer: Self-pay

## 2022-11-16 MED ORDER — MONTELUKAST SODIUM 10 MG PO TABS
10.0000 mg | ORAL_TABLET | Freq: Every day | ORAL | 3 refills | Status: DC
  Filled 2022-11-16: qty 90, 90d supply, fill #0
  Filled 2023-02-20 – 2023-07-18 (×3): qty 30, 30d supply, fill #1
  Filled 2023-08-30: qty 30, 30d supply, fill #2

## 2022-11-20 ENCOUNTER — Other Ambulatory Visit (HOSPITAL_COMMUNITY): Payer: Self-pay

## 2022-11-20 ENCOUNTER — Telehealth: Payer: 59 | Admitting: Physician Assistant

## 2022-11-20 DIAGNOSIS — J4531 Mild persistent asthma with (acute) exacerbation: Secondary | ICD-10-CM | POA: Diagnosis not present

## 2022-11-20 DIAGNOSIS — B9689 Other specified bacterial agents as the cause of diseases classified elsewhere: Secondary | ICD-10-CM

## 2022-11-20 DIAGNOSIS — J208 Acute bronchitis due to other specified organisms: Secondary | ICD-10-CM

## 2022-11-20 MED ORDER — AZITHROMYCIN 250 MG PO TABS
ORAL_TABLET | ORAL | 0 refills | Status: AC
  Filled 2022-11-20: qty 6, 5d supply, fill #0

## 2022-11-20 NOTE — Patient Instructions (Signed)
Katherine Parrish, thank you for joining Mar Daring, PA-C for today's virtual visit.  While this provider is not your primary care provider (PCP), if your PCP is located in our provider database this encounter information will be shared with them immediately following your visit.   [facility] account gives you access to today's visit and all your visits, tests, and labs performed at [facility] " click here if you don't have a [facility] account or go to [email or web address]  Consent: (Patient) Arrihanna Alberici provided verbal consent for this virtual visit at the beginning of the encounter.  Current Medications:  Current Outpatient Medications:    albuterol (VENTOLIN HFA) 108 (90 Base) MCG/ACT inhaler, Inhale 1-2 puffs into the lungs every 6 (six) hours as needed for wheezing or shortness of breath., Disp: 18 g, Rfl: 1   amLODipine (NORVASC) 5 MG tablet, Take 1 tablet (5 mg total) by mouth daily., Disp: 90 tablet, Rfl: 3   azithromycin (ZITHROMAX) 250 MG tablet, Take 2 tablets on day 1, then 1 tablet daily on days 2 through 5, Disp: 6 tablet, Rfl: 0   fluticasone (FLONASE) 50 MCG/ACT nasal spray, Place 2 sprays into both nostrils daily., Disp: 16 g, Rfl: 2   hydrochlorothiazide (MICROZIDE) 12.5 MG capsule, TAKE 1 CAPSULE BY MOUTH EVERY DAY, Disp: 90 capsule, Rfl: 1   meclizine (ANTIVERT) 12.5 MG tablet, Take 1 tablet (12.5 mg total) by mouth 3 (three) times daily as needed for dizziness., Disp: 30 tablet, Rfl: 0   meloxicam (MOBIC) 15 MG tablet, Take 1 tablet (15 mg total) by mouth daily., Disp: 30 tablet, Rfl: 1   montelukast (SINGULAIR) 10 MG tablet, Take 1 tablet (10 mg total) by mouth at bedtime., Disp: 90 tablet, Rfl: 3   Semaglutide-Weight Management (WEGOVY) 0.25 MG/0.5ML SOAJ, Inject 0.25 mg into the skin once a week., Disp: 2 mL, Rfl: 0   sertraline (ZOLOFT) 100 MG tablet, Take 1 tablet (100 mg total) by mouth daily., Disp: 90 tablet, Rfl: 1    tiZANidine (ZANAFLEX) 2 MG tablet, Take 1 - 2 tablets (2 - 4 mg total) by mouth at bedtime as needed for muscle spasms., Disp: 30 tablet, Rfl: 1   traZODone (DESYREL) 50 MG tablet, Take 0.5-1 tablets (25-50 mg total) by mouth at bedtime as needed for sleep., Disp: 90 tablet, Rfl: 0   Medications ordered in this encounter:  Meds ordered this encounter  Medications   azithromycin (ZITHROMAX) 250 MG tablet    Sig: Take 2 tablets on day 1, then 1 tablet daily on days 2 through 5    Dispense:  6 tablet    Refill:  0    Order Specific Question:   Supervising Provider    Answer:   Chase Picket [id number]     *If you need refills on other medications prior to your next appointment, please contact your pharmacy*  Follow-Up: Call back or seek an in-person evaluation if the symptoms worsen or if the condition fails to improve as anticipated.  [place] [phone]  Other Instructions  Acute Bronchitis, Adult  Acute bronchitis is sudden inflammation of the main airways (bronchi) that come off the windpipe (trachea) in the lungs. The swelling causes the airways to get smaller and make more mucus than normal. This can make it hard to breathe and can cause coughing or noisy breathing (wheezing). Acute bronchitis may last several weeks. The cough may last longer. Allergies, asthma, and exposure to smoke may  make the condition worse. What are the causes? This condition can be caused by germs and by substances that irritate the lungs, including: Cold and flu viruses. The most common cause of this condition is the virus that causes the common cold. Bacteria. This is less common. Breathing in substances that irritate the lungs, including: Smoke from cigarettes and other forms of tobacco. Dust and pollen. Fumes from household cleaning products, gases, or burned fuel. Indoor or outdoor air pollution. What increases the risk? The following factors may make you more likely to  develop this condition: A weak body's defense system, also called the immune system. A condition that affects your lungs and breathing, such as asthma. What are the signs or symptoms? Common symptoms of this condition include: Coughing. This may bring up clear, yellow, or green mucus from your lungs (sputum). Wheezing. Runny or stuffy nose. Having too much mucus in your lungs (chest congestion). Shortness of breath. Aches and pains, including sore throat or chest. How is this diagnosed? This condition is usually diagnosed based on: Your symptoms and medical history. A physical exam. You may also have other tests, including tests to rule out other conditions, such as pneumonia. These tests include: A test of lung function. Test of a mucus sample to look for the presence of bacteria. Tests to check the oxygen level in your blood. Blood tests. Chest X-ray. How is this treated? Most cases of acute bronchitis clear up over time without treatment. Your health care provider may recommend: Drinking more fluids to help thin your mucus so it is easier to cough up. Taking inhaled medicine (inhaler) to improve air flow in and out of your lungs. Using a vaporizer or a humidifier. These are machines that add water to the air to help you breathe better. Taking a medicine that thins mucus and clears congestion (expectorant). Taking a medicine that prevents or stops coughing (cough suppressant). It is not common to take an antibiotic medicine for this condition. Follow these instructions at home:  Take over-the-counter and prescription medicines only as told by your health care provider. Use an inhaler, vaporizer, or humidifier as told by your health care provider. Take two teaspoons (10 mL) of honey at bedtime to lessen coughing at night. Drink enough fluid to keep your urine pale yellow. Do not use any products that contain nicotine or tobacco. These products include cigarettes, chewing tobacco,  and vaping devices, such as e-cigarettes. If you need help quitting, ask your health care provider. Get plenty of rest. Return to your normal activities as told by your health care provider. Ask your health care provider what activities are safe for you. Keep all follow-up visits. This is important. How is this prevented? To lower your risk of getting this condition again: Wash your hands often with soap and water for at least 20 seconds. If soap and water are not available, use hand sanitizer. Avoid contact with people who have cold symptoms. Try not to touch your mouth, nose, or eyes with your hands. Avoid breathing in smoke or chemical fumes. Breathing smoke or chemical fumes will make your condition worse. Get the flu shot every year. Contact a health care provider if: Your symptoms do not improve after 2 weeks. You have trouble coughing up the mucus. Your cough keeps you awake at night. You have a fever. Get help right away if you: Cough up blood. Feel pain in your chest. Have severe shortness of breath. Faint or keep feeling like you are going to  faint. Have a severe headache. Have a fever or chills that get worse. These symptoms may represent a serious problem that is an emergency. Do not wait to see if the symptoms will go away. Get medical help right away. Call your local emergency services (911 in the [place] Do not drive yourself to the hospital. Summary Acute bronchitis is inflammation of the main airways (bronchi) that come off the windpipe (trachea) in the lungs. The swelling causes the airways to get smaller and make more mucus than normal. Drinking more fluids can help thin your mucus so it is easier to cough up. Take over-the-counter and prescription medicines only as told by your health care provider. Do not use any products that contain nicotine or tobacco. These products include cigarettes, chewing tobacco, and vaping devices, such as e-cigarettes. If you need help  quitting, ask your health care provider. Contact a health care provider if your symptoms do not improve after 2 weeks. This information is not intended to replace advice given to you by your health care provider. Make sure you discuss any questions you have with your health care provider. Document Revised: 12/15/2021 Document Reviewed: 01/05/2021 Elsevier Patient Education  [place].    If you have been instructed to have an in-person evaluation today at a local Urgent Care facility, please use the link below. It will take you to a list of all of our available [facility], including address, phone number and hours of operation. Please do not delay care.  [facility]  If you or a family member do not have a primary care provider, use the link below to schedule a visit and establish care. When you choose a [facility] primary care physician or advanced practice provider, you gain a long-term partner in health. Find a Primary Care Provider  Learn more about [facility]'s in-office and virtual care options: [facility]

## 2022-11-20 NOTE — Progress Notes (Signed)
Virtual Visit Consent   [facility], you are scheduled for a virtual visit with a [facility] provider today. Just as with appointments in the office, your consent must be obtained to participate. Your consent will be active for this visit and any virtual visit you may have with one of our providers in the next 365 days. If you have a MyChart account, a copy of this consent can be sent to you electronically.  As this is a virtual visit, video technology does not allow for your provider to perform a traditional examination. This may limit your provider's ability to fully assess your condition. If your provider identifies any concerns that need to be evaluated in person or the need to arrange testing (such as labs, EKG, etc.), we will make arrangements to do so. Although advances in technology are sophisticated, we cannot ensure that it will always work on either your end or our end. If the connection with a video visit is poor, the visit may have to be switched to a telephone visit. With either a video or telephone visit, we are not always able to ensure that we have a secure connection.  By engaging in this virtual visit, you consent to the provision of healthcare and authorize for your insurance to be billed (if applicable) for the services provided during this visit. Depending on your insurance coverage, you may receive a charge related to this service.  I need to obtain your verbal consent now. Are you willing to proceed with your visit today? Katherine Parrish has provided verbal consent on 11/20/2022 for a virtual visit (video or telephone). Mar Daring, PA-C  Date: 11/20/2022 12:34 PM  Virtual Visit via Video Note   I, Mar Daring, connected with  Katherine Parrish  ([id number], 2001/05/30) on 11/20/22 at 12:30 PM EST by a video-enabled telemedicine application and verified that I am speaking with the correct person using two identifiers.  Location: Patient: Virtual Visit Location Patient:  Other: work;isolated Provider: Virtual Visit Location Provider: Home Office   I discussed the limitations of evaluation and management by telemedicine and the availability of in person appointments. The patient expressed understanding and agreed to proceed.    History of Present Illness: Katherine Parrish is a 23 y.o. who identifies as a female who was assigned female at birth, and is being seen today for URI symptoms.  HPI: Sinusitis This is a new problem. The current episode started 1 to 4 weeks ago (2 weeks). The problem has been gradually worsening since onset. There has been no fever. Associated symptoms include congestion, coughing, headaches, shortness of breath (with cough due to asthma) and sinus pressure (improving). Pertinent negatives include no chills, ear pain, hoarse voice, sore throat or swollen glands. ( ) Treatments tried: sudafed, mucinex, benadryl, flonase. The treatment provided no relief.     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/14/2021   Thrombocytopenia ([place]) 03/14/2021   Mild intermittent asthma without complication [id number]    Allergies:  Allergies  Allergen Reactions   Aspirin Nausea Only   Medications:  Current Outpatient Medications:    albuterol (VENTOLIN HFA) 108 (90 Base) MCG/ACT inhaler, Inhale 1-2 puffs into the lungs every 6 (six) hours as needed for wheezing or shortness of breath., Disp: 18 g, Rfl: 1   amLODipine (NORVASC) 5  MG tablet, Take 1 tablet (5 mg total) by mouth daily., Disp: 90 tablet, Rfl: 3   azithromycin (ZITHROMAX) 250 MG tablet, Take 2 tablets on day 1, then 1 tablet daily on days 2 through 5, Disp: 6 tablet, Rfl: 0   fluticasone (FLONASE) 50 MCG/ACT nasal spray, Place 2 sprays into both nostrils daily., Disp: 16 g, Rfl: 2    hydrochlorothiazide (MICROZIDE) 12.5 MG capsule, TAKE 1 CAPSULE BY MOUTH EVERY DAY, Disp: 90 capsule, Rfl: 1   meclizine (ANTIVERT) 12.5 MG tablet, Take 1 tablet (12.5 mg total) by mouth 3 (three) times daily as needed for dizziness., Disp: 30 tablet, Rfl: 0   meloxicam (MOBIC) 15 MG tablet, Take 1 tablet (15 mg total) by mouth daily., Disp: 30 tablet, Rfl: 1   montelukast (SINGULAIR) 10 MG tablet, Take 1 tablet (10 mg total) by mouth at bedtime., Disp: 90 tablet, Rfl: 3   Semaglutide-Weight Management (WEGOVY) 0.25 MG/0.5ML SOAJ, Inject 0.25 mg into the skin once a week., Disp: 2 mL, Rfl: 0   sertraline (ZOLOFT) 100 MG tablet, Take 1 tablet (100 mg total) by mouth daily., Disp: 90 tablet, Rfl: 1   tiZANidine (ZANAFLEX) 2 MG tablet, Take 1 - 2 tablets (2 - 4 mg total) by mouth at bedtime as needed for muscle spasms., Disp: 30 tablet, Rfl: 1   traZODone (DESYREL) 50 MG tablet, Take 0.5-1 tablets (25-50 mg total) by mouth at bedtime as needed for sleep., Disp: 90 tablet, Rfl: 0  Observations/Objective: Patient is well-developed, well-nourished in no acute distress.  Resting comfortably  Head is normocephalic, atraumatic.  No labored breathing.  Speech is clear and coherent with logical content.  Patient is alert and oriented at baseline.    Assessment and Plan: 1. Acute bacterial bronchitis - azithromycin (ZITHROMAX) 250 MG tablet; Take 2 tablets on day 1, then 1 tablet daily on days 2 through 5  Dispense: 6 tablet; Refill: 0  2. Mild persistent asthma with exacerbation  - Worsening over a week despite OTC medications - Will treat with Z-pack  - Discussed Prednisone but will await starting this for now - Can continue Mucinex  - Push fluids.  - Rest.  - Steam and humidifier can help - Seek in person evaluation if worsening or symptoms fail to improve    Follow Up Instructions: I discussed the assessment and treatment plan with the patient. The patient was provided an opportunity to  ask questions and all were answered. The patient agreed with the plan and demonstrated an understanding of the instructions.  A copy of instructions were sent to the patient via MyChart unless otherwise noted below.    The patient was advised to call back or seek an in-person evaluation if the symptoms worsen or if the condition fails to improve as anticipated.  Time:  I spent 8 minutes with the patient via telehealth technology discussing the above problems/concerns.    Mar Daring, PA-C

## 2022-11-24 ENCOUNTER — Ambulatory Visit: Payer: 59 | Admitting: Family

## 2022-11-24 ENCOUNTER — Encounter: Payer: Self-pay | Admitting: Physician Assistant

## 2022-11-24 ENCOUNTER — Ambulatory Visit: Payer: 59 | Admitting: Physician Assistant

## 2022-12-17 ENCOUNTER — Encounter: Payer: Self-pay | Admitting: Physician Assistant

## 2022-12-18 ENCOUNTER — Other Ambulatory Visit (HOSPITAL_BASED_OUTPATIENT_CLINIC_OR_DEPARTMENT_OTHER): Payer: Self-pay

## 2022-12-18 ENCOUNTER — Other Ambulatory Visit: Payer: Self-pay

## 2022-12-18 ENCOUNTER — Encounter: Payer: Self-pay | Admitting: Cardiology

## 2022-12-18 MED ORDER — SERTRALINE HCL 100 MG PO TABS
100.0000 mg | ORAL_TABLET | Freq: Every day | ORAL | 1 refills | Status: DC
  Filled 2022-12-18: qty 90, 90d supply, fill #0

## 2022-12-18 MED ORDER — AMLODIPINE BESYLATE 5 MG PO TABS
5.0000 mg | ORAL_TABLET | Freq: Every day | ORAL | 3 refills | Status: DC
  Filled 2022-12-18: qty 90, 90d supply, fill #0
  Filled 2023-02-20: qty 90, 90d supply, fill #1

## 2022-12-18 MED ORDER — HYDROCHLOROTHIAZIDE 12.5 MG PO CAPS
12.5000 mg | ORAL_CAPSULE | Freq: Every day | ORAL | 1 refills | Status: DC
  Filled 2022-12-18: qty 30, 30d supply, fill #0
  Filled 2023-01-15: qty 30, 30d supply, fill #1
  Filled 2023-02-20: qty 30, 30d supply, fill #2

## 2022-12-22 ENCOUNTER — Other Ambulatory Visit: Payer: Self-pay | Admitting: Physician Assistant

## 2022-12-22 DIAGNOSIS — Z7689 Persons encountering health services in other specified circumstances: Secondary | ICD-10-CM | POA: Diagnosis not present

## 2022-12-23 LAB — CBC WITH DIFFERENTIAL/PLATELET
Basophils Absolute: 0 10*3/uL (ref 0.0–0.2)
Basos: 0 %
EOS (ABSOLUTE): 0.2 10*3/uL (ref 0.0–0.4)
Eos: 3 %
Hematocrit: 37 % (ref 34.0–46.6)
Hemoglobin: 12 g/dL (ref 11.1–15.9)
Immature Grans (Abs): 0 10*3/uL (ref 0.0–0.1)
Immature Granulocytes: 0 %
Lymphocytes Absolute: 1.8 10*3/uL (ref 0.7–3.1)
Lymphs: 34 %
MCH: 28.4 pg (ref 26.6–33.0)
MCHC: 32.4 g/dL (ref 31.5–35.7)
MCV: 88 fL (ref 79–97)
Monocytes Absolute: 0.5 10*3/uL (ref 0.1–0.9)
Monocytes: 9 %
Neutrophils Absolute: 2.8 10*3/uL (ref 1.4–7.0)
Neutrophils: 54 %
Platelets: 159 10*3/uL (ref 150–450)
RBC: 4.22 x10E6/uL (ref 3.77–5.28)
RDW: 13.8 % (ref 11.7–15.4)
WBC: 5.3 10*3/uL (ref 3.4–10.8)

## 2022-12-26 ENCOUNTER — Ambulatory Visit
Admission: RE | Admit: 2022-12-26 | Discharge: 2022-12-26 | Disposition: A | Discharge status: Home / Self Care | Payer: 59 | Source: Ambulatory Visit | Attending: Emergency Medicine | Admitting: Emergency Medicine

## 2022-12-26 VITALS — BP 119/78 | HR 86 | Temp 98.8°F | Resp 17

## 2022-12-26 DIAGNOSIS — J039 Acute tonsillitis, unspecified: Secondary | ICD-10-CM | POA: Diagnosis not present

## 2022-12-26 DIAGNOSIS — B2790 Infectious mononucleosis, unspecified without complication: Secondary | ICD-10-CM | POA: Insufficient documentation

## 2022-12-26 LAB — POCT MONO SCREEN (KUC): Mono, POC: POSITIVE — AB

## 2022-12-26 LAB — POCT RAPID STREP A (OFFICE): Rapid Strep A Screen: NEGATIVE

## 2022-12-26 NOTE — Discharge Instructions (Signed)
I have printed some information per year regarding mononucleosis infection and self-care at home.  As you are aware, there is no treatment for mono other than rest and supportive medications such as ibuprofen for pain.  Please avoid contact sports or any activities that put you at risk for being hit in the belly, this can increase your risk of rupturing your spleen.  Please follow-up with your primary care provider if you need to discuss taking time off from work.  I have provided you with a note to be out of work for the next 3 days which is all we are able to provide here at urgent care.  Thank you for visiting [facility] urgent care today.  We appreciate the opportunity to participtate your care.

## 2022-12-26 NOTE — ED Triage Notes (Signed)
Pt c/o sore throat for 3 days. Hx large tonsils with strep and tonsillitis. Hasn't done medications for pain

## 2022-12-26 NOTE — ED Provider Notes (Signed)
[facility]    CSN: [id number] Arrival date & time: 12/26/22  1145    HISTORY   Chief Complaint  Patient presents with   Sore Throat    Sore throat & nausea - Entered by patient   HPI Katherine Parrish is a pleasant, 22 y.o. female who presents to urgent care today. Pt c/o sore throat and pain with swallowing for 3 days. Pt states she has a known hx having large tonsils and has frequent episodes of strep throat and tonsillitis.  Patient states she is also noticed that the left side of her neck feels tender and swollen.  Patient states has not tried anything to alleviate her pain.  Patient also states that for a few days prior to sore throat she has been feeling more fatigued than usual.  Patient denies known sick contacts.  The history is provided by the patient.   Past Medical History:  Diagnosis Date   Asthma    Vaginal delivery 03/15/2021   Patient Active Problem List   Diagnosis Date Noted   Lumbar back pain 09/22/2022   Genetic testing 08/04/2022   Vitamin D deficiency 12/14/2021   Asthma 06/03/2021   Chronic hypertension with superimposed pre-eclampsia 03/14/2021   Thrombocytopenia 03/14/2021   Mild intermittent asthma without complication 12/13/2017   Past Surgical History:  Procedure Laterality Date   HERNIA REPAIR     OB History     Gravida  1   Para      Term      Preterm      AB      Living         SAB      IAB      Ectopic      Multiple      Live Births             Home Medications    Prior to Admission medications   Medication Sig Start Date End Date Taking? Authorizing Provider  albuterol (VENTOLIN HFA) 108 (90 Base) MCG/ACT inhaler Inhale 1-2 puffs into the lungs every 6 (six) hours as needed for wheezing or shortness of breath. 08/04/21   Jarold Motto, PA  amLODipine (NORVASC) 5 MG tablet Take 1 tablet (5 mg total) by mouth daily. 12/18/22   Tobb, Kardie, DO  fluticasone (FLONASE) 50 MCG/ACT nasal spray Place 2 sprays  into both nostrils daily. 09/05/22   Jarold Motto, PA  hydrochlorothiazide (MICROZIDE) 12.5 MG capsule Take 1 capsule (12.5 mg total) by mouth daily. 12/18/22   Jarold Motto, PA  meclizine (ANTIVERT) 12.5 MG tablet Take 1 tablet (12.5 mg total) by mouth 3 (three) times daily as needed for dizziness. 05/18/22   Jarold Motto, PA  meloxicam (MOBIC) 15 MG tablet Take 1 tablet (15 mg total) by mouth daily. 09/22/22   Karie Schwalbe, MD  montelukast (SINGULAIR) 10 MG tablet Take 1 tablet (10 mg total) by mouth at bedtime. 11/16/22   Jarold Motto, PA  Semaglutide-Weight Management (WEGOVY) 0.25 MG/0.5ML SOAJ Inject 0.25 mg into the skin once a week. 09/22/22   Jarold Motto, PA  sertraline (ZOLOFT) 100 MG tablet Take 1 tablet (100 mg total) by mouth daily. 12/18/22   Jarold Motto, PA  tiZANidine (ZANAFLEX) 2 MG tablet Take 1 - 2 tablets (2 - 4 mg total) by mouth at bedtime as needed for muscle spasms. 09/22/22   Karie Schwalbe, MD  traZODone (DESYREL) 50 MG tablet Take 0.5-1 tablets (25-50 mg total) by mouth at bedtime  as needed for sleep. 09/22/22   Jarold MottoWorley, Samantha, PA  Fluticasone Propionate, Inhal, 100 MCG/ACT AEPB Inhale 1 puff into the lungs in the morning and at bedtime. 10/05/21 10/06/21  Jarold MottoWorley, Samantha, PA    Family History Family History  Problem Relation Age of Onset   Uterine cancer Mother 846       leiomyosarcoma; d. 954   Hypertension Mother    Thyroid disease Mother    Breast cancer Maternal Grandmother        dx 30s/40s   Cancer Paternal Grandfather        dx 1470s; unknown primary; ? lung   Colon cancer Neg Hx    Social History Social History   Tobacco Use   Smoking status: Never   Smokeless tobacco: Never  Vaping Use   Vaping Use: Never used  Substance Use Topics   Alcohol use: No   Drug use: No   Allergies   Aspirin  Review of Systems Review of Systems Pertinent findings revealed after performing a 14 point review of systems has been noted in the  history of present illness.  Physical Exam Vital Signs BP 119/78 (BP Location: Right Arm)   Pulse 86   Temp 98.8 F (37.1 C) (Oral)   Resp 17   LMP 12/02/2022   SpO2 97%   No data found.  Physical Exam Vitals and nursing note reviewed.  Constitutional:      General: She is not in acute distress.    Appearance: She is well-developed. She is ill-appearing. She is not toxic-appearing.  HENT:     Head: Normocephalic and atraumatic.     Salivary Glands: Right salivary gland is diffusely enlarged and tender. Left salivary gland is diffusely enlarged and tender.     Right Ear: Hearing and external ear normal.     Left Ear: Hearing and external ear normal.     Ears:     Comments: Bilateral EACs with mild erythema, bilateral TMs are normal    Nose: No mucosal edema, congestion or rhinorrhea.     Right Turbinates: Not enlarged, swollen or pale.     Left Turbinates: Not enlarged or swollen.     Right Sinus: No maxillary sinus tenderness or frontal sinus tenderness.     Left Sinus: No maxillary sinus tenderness or frontal sinus tenderness.     Mouth/Throat:     Lips: Pink. No lesions.     Mouth: Mucous membranes are moist. No oral lesions or angioedema.     Dentition: No gingival swelling.     Tongue: No lesions.     Palate: No mass.     Pharynx: Uvula midline. Pharyngeal swelling, oropharyngeal exudate and posterior oropharyngeal erythema present. No uvula swelling.     Tonsils: Tonsillar exudate present. 2+ on the right. 2+ on the left.  Eyes:     Extraocular Movements: Extraocular movements intact.     Conjunctiva/sclera: Conjunctivae normal.     Pupils: Pupils are equal, round, and reactive to light.  Neck:     Thyroid: No thyroid mass, thyromegaly or thyroid tenderness.     Trachea: Tracheal tenderness present. No abnormal tracheal secretions or tracheal deviation.     Comments: Voice is muffled Cardiovascular:     Rate and Rhythm: Normal rate and regular rhythm.     Pulses:  Normal pulses.     Heart sounds: Normal heart sounds, S1 normal and S2 normal. No murmur heard.    No friction rub. No gallop.  Pulmonary:  Effort: Pulmonary effort is normal. No accessory muscle usage, prolonged expiration, respiratory distress or retractions.     Breath sounds: No stridor, decreased air movement or transmitted upper airway sounds. No decreased breath sounds, wheezing, rhonchi or rales.  Abdominal:     General: Bowel sounds are normal.     Palpations: Abdomen is soft.     Tenderness: There is generalized abdominal tenderness. There is no right CVA tenderness, left CVA tenderness or rebound. Negative signs include Murphy's sign.     Hernia: No hernia is present.  Musculoskeletal:        General: No tenderness. Normal range of motion.     Cervical back: Full passive range of motion without pain, normal range of motion and neck supple.     Right lower leg: No edema.     Left lower leg: No edema.  Lymphadenopathy:     Cervical: Cervical adenopathy present.     Right cervical: Superficial cervical adenopathy present.     Left cervical: Superficial cervical adenopathy present.  Skin:    General: Skin is warm and dry.     Findings: No erythema, lesion or rash.  Neurological:     General: No focal deficit present.     Mental Status: She is alert and oriented to person, place, and time. Mental status is at baseline.  Psychiatric:        Mood and Affect: Mood normal.        Behavior: Behavior normal.        Thought Content: Thought content normal.        Judgment: Judgment normal.     Visual Acuity Right Eye Distance:   Left Eye Distance:   Bilateral Distance:    Right Eye Near:   Left Eye Near:    Bilateral Near:     UC Couse / Diagnostics / Procedures:     Radiology No results found.  Procedures Procedures (including critical care time) EKG  Pending results:  Labs Reviewed  POCT MONO SCREEN ([facility]) - Abnormal; Notable for the following components:       Result Value   Mono, POC Positive (*)    All other components within normal limits  CULTURE, GROUP A STREP [facility])  POCT RAPID STREP A (OFFICE)    Medications Ordered in UC: Medications - No data to display  UC Diagnoses / Final Clinical Impressions(s)   I have reviewed the triage vital signs and the nursing notes.  Pertinent labs & imaging results that were available during my care of the patient were reviewed by me and considered in my medical decision making (see chart for details).    Final diagnoses:  Acute tonsillitis, unspecified etiology  Infectious mononucleosis without complication, infectious mononucleosis due to unspecified organism   Rapid strep test today is negative, throat culture pending.  Mononucleosis test was positive.  Patient provided with education regarding self-care at home.  Patient advised to follow-up with PCP if she needs extended time out of work.  Conservative care recommended.  Return precautions advised.  Please see discharge instructions below for further details of plan of care as provided to patient. ED Prescriptions   None    PDMP not reviewed this encounter.  Disposition Upon Discharge:  Condition: stable for discharge home Home: take medications as prescribed; routine discharge instructions as discussed; follow up as advised.  Patient presented with an acute illness with associated systemic symptoms and significant discomfort requiring urgent management. In my opinion, this is a condition that  a prudent lay person (someone who possesses an average knowledge of health and medicine) may potentially expect to result in complications if not addressed urgently such as respiratory distress, impairment of bodily function or dysfunction of bodily organs.   Routine symptom specific, illness specific and/or disease specific instructions were discussed with the patient and/or caregiver at length.   As such, the patient has been evaluated and  assessed, work-up was performed and treatment was provided in alignment with urgent care protocols and evidence based medicine.  Patient/parent/caregiver has been advised that the patient may require follow up for further testing and treatment if the symptoms continue in spite of treatment, as clinically indicated and appropriate.  If the patient was tested for COVID-19, Influenza and/or RSV, then the patient/parent/guardian was advised to isolate at home pending the results of his/her diagnostic coronavirus test and potentially longer if they're positive. I have also advised pt that if his/her COVID-19 test returns positive, it's recommended to self-isolate for at least 10 days after symptoms first appeared AND until fever-free for 24 hours without fever reducer AND other symptoms have improved or resolved. Discussed self-isolation recommendations as well as instructions for household member/close contacts as per the [facility] and [place] DHHS, and also gave patient the COVID packet with this information.  Patient/parent/caregiver has been advised to return to the [facility] or PCP in 3-5 days if no better; to PCP or the Emergency Department if new signs and symptoms develop, or if the current signs or symptoms continue to change or worsen for further workup, evaluation and treatment as clinically indicated and appropriate  The patient will follow up with their current PCP if and as advised. If the patient does not currently have a PCP we will assist them in obtaining one.   The patient may need specialty follow up if the symptoms continue, in spite of conservative treatment and management, for further workup, evaluation, consultation and treatment as clinically indicated and appropriate.  Patient/parent/caregiver verbalized understanding and agreement of plan as discussed.  All questions were addressed during visit.  Please see discharge instructions below for further details of plan.  Discharge  Instructions:   Discharge Instructions      I have printed some information per year regarding mononucleosis infection and self-care at home.  As you are aware, there is no treatment for mono other than rest and supportive medications such as ibuprofen for pain.  Please avoid contact sports or any activities that put you at risk for being hit in the belly, this can increase your risk of rupturing your spleen.  Please follow-up with your primary care provider if you need to discuss taking time off from work.  I have provided you with a note to be out of work for the next 3 days which is all we are able to provide here at urgent care.  Thank you for visiting [facility] urgent care today.  We appreciate the opportunity to participtate your care.      This office note has been dictated using Teaching laboratory technician.  Unfortunately, this method of dictation can sometimes lead to typographical or grammatical errors.  I apologize for your inconvenience in advance if this occurs.  Please do not hesitate to reach out to me if clarification is needed.      Theadora Rama Scales, PA-C 12/26/22 1228

## 2022-12-28 LAB — CULTURE, GROUP A STREP (THRC)

## 2022-12-31 LAB — CULTURE, GROUP A STREP (THRC)

## 2023-01-16 ENCOUNTER — Encounter: Payer: Self-pay | Admitting: Physician Assistant

## 2023-01-17 ENCOUNTER — Encounter (HOSPITAL_BASED_OUTPATIENT_CLINIC_OR_DEPARTMENT_OTHER): Payer: Self-pay

## 2023-01-17 ENCOUNTER — Emergency Department (HOSPITAL_BASED_OUTPATIENT_CLINIC_OR_DEPARTMENT_OTHER)
Admission: EM | Admit: 2023-01-17 | Discharge: 2023-01-17 | Disposition: A | Discharge status: Home / Self Care | Payer: 59 | Attending: Emergency Medicine | Admitting: Emergency Medicine

## 2023-01-17 ENCOUNTER — Telehealth (HOSPITAL_BASED_OUTPATIENT_CLINIC_OR_DEPARTMENT_OTHER): Payer: Self-pay

## 2023-01-17 ENCOUNTER — Other Ambulatory Visit (HOSPITAL_BASED_OUTPATIENT_CLINIC_OR_DEPARTMENT_OTHER): Payer: Self-pay

## 2023-01-17 ENCOUNTER — Other Ambulatory Visit: Payer: Self-pay | Admitting: Physician Assistant

## 2023-01-17 ENCOUNTER — Emergency Department (HOSPITAL_BASED_OUTPATIENT_CLINIC_OR_DEPARTMENT_OTHER): Payer: 59

## 2023-01-17 ENCOUNTER — Other Ambulatory Visit: Payer: Self-pay

## 2023-01-17 DIAGNOSIS — R519 Headache, unspecified: Secondary | ICD-10-CM | POA: Diagnosis not present

## 2023-01-17 DIAGNOSIS — Z79899 Other long term (current) drug therapy: Secondary | ICD-10-CM | POA: Diagnosis not present

## 2023-01-17 DIAGNOSIS — G4452 New daily persistent headache (NDPH): Secondary | ICD-10-CM

## 2023-01-17 DIAGNOSIS — I10 Essential (primary) hypertension: Secondary | ICD-10-CM | POA: Insufficient documentation

## 2023-01-17 HISTORY — DX: Essential (primary) hypertension: I10

## 2023-01-17 LAB — CBC
HCT: 37.4 % (ref 36.0–46.0)
Hemoglobin: 12 g/dL (ref 12.0–15.0)
MCH: 28.2 pg (ref 26.0–34.0)
MCHC: 32.1 g/dL (ref 30.0–36.0)
MCV: 88 fL (ref 80.0–100.0)
Platelets: 176 10*3/uL (ref 150–400)
RBC: 4.25 MIL/uL (ref 3.87–5.11)
RDW: 14.8 % (ref 11.5–15.5)
WBC: 5.4 10*3/uL (ref 4.0–10.5)
nRBC: 0 % (ref 0.0–0.2)

## 2023-01-17 LAB — COMPREHENSIVE METABOLIC PANEL
ALT: 11 U/L (ref 0–44)
AST: 15 U/L (ref 15–41)
Albumin: 4.2 g/dL (ref 3.5–5.0)
Alkaline Phosphatase: 54 U/L (ref 38–126)
Anion gap: 7 (ref 5–15)
BUN: 19 mg/dL (ref 6–20)
CO2: 28 mmol/L (ref 22–32)
Calcium: 9.4 mg/dL (ref 8.9–10.3)
Chloride: 104 mmol/L (ref 98–111)
Creatinine, Ser: 0.59 mg/dL (ref 0.44–1.00)
GFR, Estimated: >60 mL/min (ref >60)
Glucose, Bld: 87 mg/dL (ref 70–99)
Potassium: 3.3 mmol/L — ABNORMAL LOW (ref 3.5–5.1)
Sodium: 139 mmol/L (ref 135–145)
Total Bilirubin: 0.2 mg/dL — ABNORMAL LOW (ref 0.3–1.2)
Total Protein: 7.7 g/dL (ref 6.5–8.1)

## 2023-01-17 LAB — PREGNANCY, URINE: Preg Test, Ur: NEGATIVE

## 2023-01-17 LAB — HCG, SERUM, QUALITATIVE: Preg, Serum: NEGATIVE

## 2023-01-17 MED ORDER — PROCHLORPERAZINE EDISYLATE 10 MG/2ML IJ SOLN
5.0000 mg | Freq: Once | INTRAMUSCULAR | Status: AC
  Administered 2023-01-17: 5 mg via INTRAVENOUS
  Filled 2023-01-17: qty 2

## 2023-01-17 MED ORDER — SODIUM CHLORIDE 0.9 % IV BOLUS
1000.0000 mL | Freq: Once | INTRAVENOUS | Status: AC
  Administered 2023-01-17: 1000 mL via INTRAVENOUS

## 2023-01-17 MED ORDER — FLUCONAZOLE 150 MG PO TABS
ORAL_TABLET | ORAL | 0 refills | Status: DC
  Filled 2023-01-17: qty 3, 6d supply, fill #0
  Filled 2023-01-18: qty 3, 15d supply, fill #0

## 2023-01-17 MED ORDER — KETOROLAC TROMETHAMINE 15 MG/ML IJ SOLN
15.0000 mg | Freq: Once | INTRAMUSCULAR | Status: AC
  Administered 2023-01-17: 15 mg via INTRAVENOUS
  Filled 2023-01-17: qty 1

## 2023-01-17 MED ORDER — FLUCONAZOLE 150 MG PO TABS: ORAL_TABLET | ORAL | 0 refills | Status: DC

## 2023-01-17 MED ORDER — DIPHENHYDRAMINE HCL 50 MG/ML IJ SOLN
12.5000 mg | Freq: Once | INTRAMUSCULAR | Status: AC
  Administered 2023-01-17: 12.5 mg via INTRAVENOUS
  Filled 2023-01-17: qty 1

## 2023-01-17 NOTE — ED Provider Notes (Signed)
[place] EMERGENCY DEPARTMENT AT [facility] Provider Note   CSN: [id number] Arrival date & time: 01/17/23  1619     History {Add pertinent medical, surgical, social history, OB history to HPI:1} Chief Complaint  Patient presents with   Headache    Carron Mcmurry is a 22 y.o. female [facility] emergency department chief complaint of intractable headache.  Patient has had persistent aching and throbbing frontal and right-sided headache for the past week with associated nausea, dizziness, and light sensitivity.  She has a history of tension headaches but has never had a headache like this.  It is worse in the morning.  It has not improved with any over-the-counter pain relievers.  It feels better when she lies flat.  She denies nausea, vomiting, head injury, changes in vision, unilateral weakness or paresthesia.   Headache      Home Medications Prior to Admission medications   Medication Sig Start Date End Date Taking? Authorizing Provider  albuterol (VENTOLIN HFA) 108 (90 Base) MCG/ACT inhaler Inhale 1-2 puffs into the lungs every 6 (six) hours as needed for wheezing or shortness of breath. 08/04/21   Jarold Motto, PA  amLODipine (NORVASC) 5 MG tablet Take 1 tablet (5 mg total) by mouth daily. 12/18/22   Tobb, Kardie, DO  fluconazole (DIFLUCAN) 150 MG tablet Take 1 tablet by mouth once. If symptoms persist, may take an additional tablet in 3-5 days. May repeat for a total of 3 tablets. 01/17/23   Jarold Motto, PA  fluticasone (FLONASE) 50 MCG/ACT nasal spray Place 2 sprays into both nostrils daily. 09/05/22   Jarold Motto, PA  hydrochlorothiazide (MICROZIDE) 12.5 MG capsule Take 1 capsule (12.5 mg total) by mouth daily. 12/18/22   Jarold Motto, PA  meclizine (ANTIVERT) 12.5 MG tablet Take 1 tablet (12.5 mg total) by mouth 3 (three) times daily as needed for dizziness. 05/18/22   Jarold Motto, PA  meloxicam (MOBIC) 15 MG tablet Take 1 tablet (15 mg total) by mouth daily.  09/22/22   Karie Schwalbe, MD  montelukast (SINGULAIR) 10 MG tablet Take 1 tablet (10 mg total) by mouth at bedtime. 11/16/22   Jarold Motto, PA  sertraline (ZOLOFT) 100 MG tablet Take 1 tablet (100 mg total) by mouth daily. 12/18/22   Jarold Motto, PA  tiZANidine (ZANAFLEX) 2 MG tablet Take 1 - 2 tablets (2 - 4 mg total) by mouth at bedtime as needed for muscle spasms. 09/22/22   Karie Schwalbe, MD  traZODone (DESYREL) 50 MG tablet Take 0.5-1 tablets (25-50 mg total) by mouth at bedtime as needed for sleep. 09/22/22   Jarold Motto, PA  Fluticasone Propionate, Inhal, 100 MCG/ACT AEPB Inhale 1 puff into the lungs in the morning and at bedtime. 10/05/21 10/06/21  Jarold Motto, PA      Allergies    Aspirin    Review of Systems   Review of Systems  Neurological:  Positive for headaches.    Physical Exam Updated Vital Signs BP 129/79 (BP Location: Right Arm)   Pulse 83   Temp 98.5 F (36.9 C) (Oral)   Resp 16   Ht 5\' 7"  (1.702 m)   Wt 93.4 kg   LMP 12/02/2022   SpO2 99%   BMI 32.26 kg/m  Physical Exam  Physical Exam  Constitutional: Pt is oriented to person, place, and time. Pt appears well-developed and well-nourished. No distress.  HENT:  Head: Normocephalic and atraumatic.  Mouth/Throat: Oropharynx is clear and moist.  Eyes: Conjunctivae and EOM are normal.  Pupils are equal, round, and reactive to light. No scleral icterus.  No horizontal, vertical or rotational nystagmus  Neck: Normal range of motion. Neck supple.  Full active and passive ROM without pain No midline or paraspinal tenderness No nuchal rigidity or meningeal signs  Cardiovascular: Normal rate, regular rhythm and intact distal pulses.   Pulmonary/Chest: Effort normal and breath sounds normal. No respiratory distress. Pt has no wheezes. No rales.  Abdominal: Soft. Bowel sounds are normal. There is no tenderness. There is no rebound and no guarding.  Musculoskeletal: Normal range of motion.   Lymphadenopathy:    No cervical adenopathy.  Neurological: Pt. is alert and oriented to person, place, and time. He has normal reflexes. No cranial nerve deficit.  Exhibits normal muscle tone. Coordination normal.  Mental Status:  Alert, oriented, thought content appropriate. Speech fluent without evidence of aphasia. Able to follow 2 step commands without difficulty.  Cranial Nerves:  II:  Peripheral visual fields grossly normal, pupils equal, round, reactive to light III,IV, VI: ptosis not present, extra-ocular motions intact bilaterally  V,VII: smile symmetric, facial light touch sensation equal VIII: hearing grossly normal bilaterally  IX,X: midline uvula rise  XI: bilateral shoulder shrug equal and strong XII: midline tongue extension  Motor:  5/5 in upper and lower extremities bilaterally including strong and equal grip strength and dorsiflexion/plantar flexion Sensory: Pinprick and light touch normal in all extremities.  Deep Tendon Reflexes: 2+ and symmetric  Cerebellar: normal finger-to-nose with bilateral upper extremities Gait: normal gait and balance CV: distal pulses palpable throughout   Skin: Skin is warm and dry. No rash noted. Pt is not diaphoretic.  Psychiatric: Pt has a normal mood and affect. Behavior is normal. Judgment and thought content normal.  Nursing note and vitals reviewed.  ED Results / Procedures / Treatments   Labs (all labs ordered are listed, but only abnormal results are displayed) Labs Reviewed  CBC  HCG, SERUM, QUALITATIVE  COMPREHENSIVE METABOLIC PANEL  PREGNANCY, URINE    EKG None  Radiology CT Head Wo Contrast  Result Date: 01/17/2023 CLINICAL DATA:  Intermittent headaches, some photosensitivity, some nausea EXAM: CT HEAD WITHOUT CONTRAST TECHNIQUE: Contiguous axial images were obtained from the base of the skull through the vertex without intravenous contrast. RADIATION DOSE REDUCTION: This exam was performed according to the  departmental dose-optimization program which includes automated exposure control, adjustment of the mA and/or kV according to patient size and/or use of iterative reconstruction technique. COMPARISON:  None Available. FINDINGS: Brain: No evidence of acute infarction, hemorrhage, mass, mass effect, or midline shift. No hydrocephalus or extra-axial fluid collection. Normal pituitary and craniocervical junction. Vascular: No hyperdense vessel. Skull: Negative for fracture or focal lesion. Sinuses/Orbits: Minimal mucosal thickening in the left maxillary sinus. Otherwise clear paranasal sinuses. No acute finding in the orbits. Other: The mastoid air cells are well aerated. IMPRESSION: No acute intracranial process. No etiology is seen for the patient's symptoms. Electronically Signed   By: Wiliam Ke M.D.   On: 01/17/2023 19:25    Procedures Procedures  {Document cardiac monitor, telemetry assessment procedure when appropriate:1}  Medications Ordered in ED Medications  sodium chloride 0.9 % bolus 1,000 mL (1,000 mLs Intravenous New Bag/Given 01/17/23 1953)  diphenhydrAMINE (BENADRYL) injection 12.5 mg (12.5 mg Intravenous Given 01/17/23 1954)  prochlorperazine (COMPAZINE) injection 5 mg (5 mg Intravenous Given 01/17/23 1956)  ketorolac (TORADOL) 15 MG/ML injection 15 mg (15 mg Intravenous Given 01/17/23 1955)    ED Course/ Medical Decision Making/ A&P Clinical Course  as of 01/17/23 2019  Wed Jan 17, 2023  2017 CT Head Wo Contrast [AH]    Clinical Course User Index [AH] Arthor Captain, PA-C   {   Click here for ABCD2, HEART and other calculatorsREFRESH Note before signing :1}                          Medical Decision Making Amount and/or Complexity of Data Reviewed Labs: ordered. Radiology: ordered. Decision-making details documented in ED Course.  Risk Prescription drug management.   ***  {Document critical care time when appropriate:1} {Document review of labs and clinical decision tools  ie heart score, Chads2Vasc2 etc:1}  {Document your independent review of radiology images, and any outside records:1} {Document your discussion with family members, caretakers, and with consultants:1} {Document social determinants of health affecting pt's care:1} {Document your decision making why or why not admission, treatments were needed:1} Final Clinical Impression(s) / ED Diagnoses Final diagnoses:  None    Rx / DC Orders ED Discharge Orders     None

## 2023-01-17 NOTE — Discharge Instructions (Signed)

## 2023-01-17 NOTE — ED Triage Notes (Signed)
Patient here POV from Home.  Endorses Headache Intermittently for Weeks. Aleve without Relief. Some Photosensitivity. Some nausea with no fever or emesis.   NAD Noted during triage. A&Ox4. GCS 15. Ambulatory.

## 2023-01-17 NOTE — Telephone Encounter (Signed)
Patient asked me to access her chart to see if her Neurology referral has been placed.

## 2023-01-18 ENCOUNTER — Other Ambulatory Visit (HOSPITAL_BASED_OUTPATIENT_CLINIC_OR_DEPARTMENT_OTHER): Payer: Self-pay

## 2023-01-18 ENCOUNTER — Encounter: Payer: Self-pay | Admitting: Neurology

## 2023-01-18 DIAGNOSIS — R519 Headache, unspecified: Secondary | ICD-10-CM | POA: Diagnosis not present

## 2023-01-19 ENCOUNTER — Ambulatory Visit: Payer: 59 | Admitting: Physician Assistant

## 2023-01-19 NOTE — Telephone Encounter (Signed)
Spoke to pt told her she does not need to schedule an appt Lelon Mast has sent referral for Neurology. Pt verbalized understanding.

## 2023-01-22 ENCOUNTER — Other Ambulatory Visit: Payer: Self-pay | Admitting: Physician Assistant

## 2023-01-22 ENCOUNTER — Other Ambulatory Visit (HOSPITAL_BASED_OUTPATIENT_CLINIC_OR_DEPARTMENT_OTHER): Payer: Self-pay

## 2023-01-22 MED ORDER — MECLIZINE HCL 12.5 MG PO TABS
12.5000 mg | ORAL_TABLET | Freq: Three times a day (TID) | ORAL | 0 refills | Status: DC | PRN
  Filled 2023-01-22: qty 30, 10d supply, fill #0

## 2023-01-23 ENCOUNTER — Telehealth: Payer: 59 | Admitting: Physician Assistant

## 2023-01-23 ENCOUNTER — Encounter: Payer: Self-pay | Admitting: Physician Assistant

## 2023-01-23 VITALS — Ht 67.0 in | Wt 208.0 lb

## 2023-01-23 NOTE — Progress Notes (Signed)
Patient was scheduled to be seen however due to busy schedule I was running 45 minutes late and patient was unable to keep appointment  Patient was not seen by me - no charge.  We attempted to contact her multiple times to reschedule.

## 2023-01-25 NOTE — Progress Notes (Signed)
NEUROLOGY CONSULTATION NOTE  Katherine Parrish MRN: [id number] DOB: 08/23/2001  Referring provider: Jarold Motto, PA Primary care provider: Jarold Motto, PA  Reason for consult:  headache  Assessment/Plan:   Chronic migraine with aura, with status migrainosus, not intractable  Migraine prevention:  start nortriptyline 10mg  at bedtime.  We can increase dose to 25mg  at bedtime in 4 weeks if needed. Migraine rescue:  Stop all OTC analgesics.  Try Maxalt-MLT 10mg  with Zofran ODT 4mg .  Use meclizine sparingly Limit use of pain relievers to no more than 2 days out of week to prevent risk of rebound or medication-overuse headache. Keep headache diary Follow up 6 months.    Subjective:  Katherine Parrish is a 22 year old female with HTN, thrombocytopenia and asthma who presents for headache.  History supplemented by ED and referring provider's notes.  CT head personally reviewed.  History of migraines for several years.  Used to last a day and occur every few weeks.  They started to increase after she had her son in 2022.  After she gave birth, she had an IUD implanted.  They became more frequent.  She had her IUD removed in August 2023 which was minimally helpful.  At that time, she had several ED visits.  She was most recently seen in the ED on 01/17/2023, where CT head performed was normal.  They occur 15 days a month on average.  Most recently, her migraine lasted 2 1/2 weeks.  When it ended, her period started.  Migraines are 7/10 right frontal pressure with left retro-orbital pressure associated with dizziness, nausea, photophobia and osmophobia.  No associated visual disturbance.  Quick head movements and positional changes aggravate dizziness.  Rest helps with headaches.  Takes multiple over the counter NSAIDs and analgesics 3 days a week.   afterwards   Past NSAIDS/analgesics:  naproxen 500mg  Past abortive triptans:  none Past abortive ergotamine:  none Past muscle relaxants:   none Past anti-emetic:  Zofran ODT 8mg , Compazine Past antihypertensive medications:  none Past antidepressant medications:  sertraline Past anticonvulsant medications:  none Past anti-CGRP:  none Past vitamins/Herbal/Supplements:  none Past antihistamines/decongestants:  none Other past therapies:  none  Current NSAIDS/analgesics:  Tylenol, ibuprofen, naproxen, BC powder Current triptans:  none Current ergotamine:  none Current anti-emetic:  none Current muscle relaxants:  tizanidine 2-4mg  QHS PRN (muscle spasms) Current Antihypertensive medications:  amlodipine, HCTZ Current Antidepressant medications:  none Current Anticonvulsant medications:  none Current anti-CGRP:  none Current Vitamins/Herbal/Supplements:  none Current Antihistamines/Decongestants:  meclizine, Flonase Other therapy:  none Birth control:  none   Caffeine:  tries to avoid caffeine Diet:  32 oz water daily.  No soda.  Does not skip meals. Exercise:  no Depression:  none; Anxiety:  none Other pain:  no Sleep hygiene:  improved. Family history of headache:  no      PAST MEDICAL HISTORY: Past Medical History:  Diagnosis Date   Asthma    Hypertension    Vaginal delivery 03/15/2021    PAST SURGICAL HISTORY: Past Surgical History:  Procedure Laterality Date   HERNIA REPAIR      MEDICATIONS: Current Outpatient Medications on File Prior to Visit  Medication Sig Dispense Refill   albuterol (VENTOLIN HFA) 108 (90 Base) MCG/ACT inhaler Inhale 1-2 puffs into the lungs every 6 (six) hours as needed for wheezing or shortness of breath. 18 g 1   amLODipine (NORVASC) 5 MG tablet Take 1 tablet (5 mg total) by mouth daily. 90 tablet 3  fluconazole (DIFLUCAN) 150 MG tablet Take 1 tablet by mouth once. If symptoms persist, may take an additional tablet in 3-5 days. May repeat for a total of 3 tablets. 3 tablet 0   fluticasone (FLONASE) 50 MCG/ACT nasal spray Place 2 sprays into both nostrils daily. 16 g 2    hydrochlorothiazide (MICROZIDE) 12.5 MG capsule Take 1 capsule (12.5 mg total) by mouth daily. 90 capsule 1   meclizine (ANTIVERT) 12.5 MG tablet Take 1 tablet (12.5 mg total) by mouth 3 (three) times daily as needed for dizziness. 30 tablet 0   montelukast (SINGULAIR) 10 MG tablet Take 1 tablet (10 mg total) by mouth at bedtime. 90 tablet 3   tiZANidine (ZANAFLEX) 2 MG tablet Take 1 - 2 tablets (2 - 4 mg total) by mouth at bedtime as needed for muscle spasms. 30 tablet 1   [DISCONTINUED] Fluticasone Propionate, Inhal, 100 MCG/ACT AEPB Inhale 1 puff into the lungs in the morning and at bedtime. 60 each 1   No current facility-administered medications on file prior to visit.    ALLERGIES: Allergies  Allergen Reactions   Aspirin Nausea Only    FAMILY HISTORY: Family History  Problem Relation Age of Onset   Uterine cancer Mother 7       leiomyosarcoma; d. 64   Hypertension Mother    Thyroid disease Mother    Breast cancer Maternal Grandmother        dx 30s/40s   Cancer Paternal Grandfather        dx 70s; unknown primary; ? lung   Colon cancer Neg Hx     Objective:  Blood pressure 128/75, pulse 98, height 5\' 8"  (1.727 m), weight 210 lb (95.3 kg), last menstrual period 12/27/2022. General: No acute distress.  Patient appears well-groomed.   Head:  Normocephalic/atraumatic Eyes:  fundi examined but not visualized Neck: supple, no paraspinal tenderness, full range of motion Back: No paraspinal tenderness Heart: regular rate and rhythm Lungs: Clear to auscultation bilaterally. Vascular: No carotid bruits. Neurological Exam: Mental status: alert and oriented to person, place, and time, speech fluent and not dysarthric, language intact. Cranial nerves: CN I: not tested CN II: pupils equal, round and reactive to light, visual fields intact CN III, IV, VI:  full range of motion, no nystagmus, no ptosis CN V: facial sensation intact. CN VII: upper and lower face symmetric CN VIII:  hearing intact CN IX, X: gag intact, uvula midline CN XI: sternocleidomastoid and trapezius muscles intact CN XII: tongue midline Bulk & Tone: normal, no fasciculations. Motor:  muscle strength 5/5 throughout Sensation:  Temperature and vibratory sensation intact. Deep Tendon Reflexes:  2+ throughout,  toes downgoing.   Finger to nose testing:  Without dysmetria.   Heel to shin:  Without dysmetria.   Gait:  Normal station and stride.  Romberg negative.    Thank you for allowing me to take part in the care of this patient.  Shon Millet, DO  CC: Jarold Motto, PA

## 2023-01-29 ENCOUNTER — Other Ambulatory Visit (HOSPITAL_BASED_OUTPATIENT_CLINIC_OR_DEPARTMENT_OTHER): Payer: Self-pay

## 2023-01-29 ENCOUNTER — Ambulatory Visit (INDEPENDENT_AMBULATORY_CARE_PROVIDER_SITE_OTHER): Payer: 59 | Admitting: Neurology

## 2023-01-29 ENCOUNTER — Encounter: Payer: Self-pay | Admitting: Neurology

## 2023-01-29 VITALS — BP 128/75 | HR 98 | Ht 68.0 in | Wt 210.0 lb

## 2023-01-29 DIAGNOSIS — G43E01 Chronic migraine with aura, not intractable, with status migrainosus: Secondary | ICD-10-CM

## 2023-01-29 MED ORDER — NORTRIPTYLINE HCL 10 MG PO CAPS
10.0000 mg | ORAL_CAPSULE | Freq: Every day | ORAL | 5 refills | Status: DC
  Filled 2023-01-29 – 2023-01-31 (×2): qty 30, 30d supply, fill #0

## 2023-01-29 MED ORDER — ONDANSETRON 4 MG PO TBDP
4.0000 mg | ORAL_TABLET | Freq: Three times a day (TID) | ORAL | 5 refills | Status: DC | PRN
  Filled 2023-01-29 – 2023-01-31 (×2): qty 18, 21d supply, fill #0
  Filled 2023-12-13: qty 18, 21d supply, fill #1

## 2023-01-29 MED ORDER — RIZATRIPTAN BENZOATE 10 MG PO TBDP
10.0000 mg | ORAL_TABLET | ORAL | 5 refills | Status: DC | PRN
  Filled 2023-01-29: qty 9, 5d supply, fill #0
  Filled 2023-01-31: qty 9, 30d supply, fill #0

## 2023-01-29 NOTE — Patient Instructions (Addendum)
  Start nortriptyline 10mg  at bedtime.  Contact us in 4 weeks with update and we can increase dose if needed. STOP ALL OVER THE COUNTER ANALGESICS.  Take rizatriptan 10mg  at earliest onset of headache.  May repeat dose once in 2 hours if needed.  Maximum 2 tablets in 24 hours.  Take ondansetron for nausea Use meclizine sparingly Limit use of pain relievers to no more than 2 days out of the week.  These medications include acetaminophen, NSAIDs (ibuprofen/Advil/Motrin, naproxen/Aleve, triptans (Imitrex/sumatriptan), Excedrin, and narcotics.  This will help reduce risk of rebound headaches. Routine exercise Stay adequately hydrated (aim for 64 oz water daily) Keep headache diary Maintain proper stress management Maintain proper sleep hygiene Do not skip meals Consider supplements:   2 Migrelief daily (magnesium, riboflavin, feverfew), Coenzyme Q10 300mg  daily

## 2023-01-31 ENCOUNTER — Other Ambulatory Visit (HOSPITAL_BASED_OUTPATIENT_CLINIC_OR_DEPARTMENT_OTHER): Payer: Self-pay

## 2023-01-31 ENCOUNTER — Telehealth: Payer: 59

## 2023-01-31 ENCOUNTER — Telehealth: Payer: 59 | Admitting: Nurse Practitioner

## 2023-01-31 DIAGNOSIS — R051 Acute cough: Secondary | ICD-10-CM

## 2023-01-31 DIAGNOSIS — J0140 Acute pansinusitis, unspecified: Secondary | ICD-10-CM | POA: Diagnosis not present

## 2023-01-31 DIAGNOSIS — J4521 Mild intermittent asthma with (acute) exacerbation: Secondary | ICD-10-CM

## 2023-01-31 MED ORDER — AMOXICILLIN-POT CLAVULANATE 875-125 MG PO TABS
1.0000 | ORAL_TABLET | Freq: Two times a day (BID) | ORAL | 0 refills | Status: AC
Start: 2023-01-31 — End: 2023-02-07
  Filled 2023-01-31: qty 14, 7d supply, fill #0

## 2023-01-31 MED ORDER — ALBUTEROL SULFATE HFA 108 (90 BASE) MCG/ACT IN AERS
2.0000 | INHALATION_SPRAY | Freq: Four times a day (QID) | RESPIRATORY_TRACT | 0 refills | Status: DC | PRN
  Filled 2023-01-31: qty 6.7, 25d supply, fill #0

## 2023-01-31 MED ORDER — BENZONATATE 100 MG PO CAPS
100.0000 mg | ORAL_CAPSULE | Freq: Three times a day (TID) | ORAL | 0 refills | Status: DC | PRN
Start: 2023-01-31 — End: 2023-05-02
  Filled 2023-01-31: qty 30, 10d supply, fill #0

## 2023-01-31 NOTE — Progress Notes (Signed)
Virtual Visit Consent   Katherine Parrish, you are scheduled for a virtual visit with a [facility] provider today. Just as with appointments in the office, your consent must be obtained to participate. Your consent will be active for this visit and any virtual visit you may have with one of our providers in the next 365 days. If you have a MyChart account, a copy of this consent can be sent to you electronically.  As this is a virtual visit, video technology does not allow for your provider to perform a traditional examination. This may limit your provider's ability to fully assess your condition. If your provider identifies any concerns that need to be evaluated in person or the need to arrange testing (such as labs, EKG, etc.), we will make arrangements to do so. Although advances in technology are sophisticated, we cannot ensure that it will always work on either your end or our end. If the connection with a video visit is poor, the visit may have to be switched to a telephone visit. With either a video or telephone visit, we are not always able to ensure that we have a secure connection.  By engaging in this virtual visit, you consent to the provision of healthcare and authorize for your insurance to be billed (if applicable) for the services provided during this visit. Depending on your insurance coverage, you may receive a charge related to this service.  I need to obtain your verbal consent now. Are you willing to proceed with your visit today? Katherine Parrish has provided verbal consent on 01/31/2023 for a virtual visit (video or telephone). Katherine Simas, FNP  Date: 01/31/2023 10:01 AM  Virtual Visit via Video Note   I, Katherine Parrish, connected with  Katherine Parrish  ([id number], August 29, 2001) on 01/31/23 at 10:00 AM EDT by a video-enabled telemedicine application and verified that I am speaking with the correct person using two identifiers.  Location: Patient: Virtual Visit Location Patient:  Home Provider: Virtual Visit Location Provider: Home Office   I discussed the limitations of evaluation and management by telemedicine and the availability of in person appointments. The patient expressed understanding and agreed to proceed.    History of Present Illness: Katherine Parrish is a 22 y.o. who identifies as a female who was assigned female at birth, and is being seen today for sinus congestion.  Symptom onset was 2.5-3 weeks ago  Nasal congestion, cough that she feels is from PND but cough has progressed to hurting her ribs.  Sinus congestion has been consistent  She does have a ST with coughing and swallowing  No fevers   Hs not been tested for COVID  She has been using tylenol sinus  Also tried Alka Seltzer cold and mucous  Uses Flonase daily   She has asthma  As needed Albuterol for allergic induced asthma   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/14/2021   Thrombocytopenia (HCC) 03/14/2021   Mild intermittent asthma without complication 12/13/2017    Allergies:  Allergies  Allergen Reactions   Aspirin Nausea Only   Medications:  Current Outpatient Medications:    albuterol (VENTOLIN HFA) 108 (90 Base) MCG/ACT inhaler, Inhale 1-2 puffs into the lungs every 6 (six) hours as needed for wheezing or shortness of breath., Disp: 18 g, Rfl: 1   amLODipine (  NORVASC) 5 MG tablet, Take 1 tablet (5 mg total) by mouth daily., Disp: 90 tablet, Rfl: 3   fluconazole (DIFLUCAN) 150 MG tablet, Take 1 tablet by mouth once. If symptoms persist, may take an additional tablet in 3-5 days. May repeat for a total of 3 tablets., Disp: 3 tablet, Rfl: 0   fluticasone (FLONASE) 50 MCG/ACT nasal spray, Place 2 sprays into both nostrils daily., Disp: 16 g, Rfl: 2   hydrochlorothiazide (MICROZIDE)  12.5 MG capsule, Take 1 capsule (12.5 mg total) by mouth daily., Disp: 90 capsule, Rfl: 1   meclizine (ANTIVERT) 12.5 MG tablet, Take 1 tablet (12.5 mg total) by mouth 3 (three) times daily as needed for dizziness., Disp: 30 tablet, Rfl: 0   montelukast (SINGULAIR) 10 MG tablet, Take 1 tablet (10 mg total) by mouth at bedtime., Disp: 90 tablet, Rfl: 3   nortriptyline (PAMELOR) 10 MG capsule, Take 1 capsule (10 mg total) by mouth at bedtime., Disp: 30 capsule, Rfl: 5   ondansetron (ZOFRAN-ODT) 4 MG disintegrating tablet, Take 1 tablet (4 mg total) by mouth every 8 (eight) hours as needed for nausea or vomiting., Disp: 20 tablet, Rfl: 5   rizatriptan (MAXALT-MLT) 10 MG disintegrating tablet, Take 1 tablet (10 mg total) by mouth as needed for migraine. May repeat in 2 hours if needed.  Maximum 2 tablets in 24 hours., Disp: 9 tablet, Rfl: 5   tiZANidine (ZANAFLEX) 2 MG tablet, Take 1 - 2 tablets (2 - 4 mg total) by mouth at bedtime as needed for muscle spasms., Disp: 30 tablet, Rfl: 1  Observations/Objective: Patient is well-developed, well-nourished in no acute distress.  Resting comfortably  at home.  Head is normocephalic, atraumatic.  No labored breathing.  Speech is clear and coherent with logical content.  Patient is alert and oriented at baseline.    Assessment and Plan: 1. Acute non-recurrent pansinusitis  - amoxicillin-clavulanate (AUGMENTIN) 875-125 MG tablet; Take 1 tablet by mouth 2 (two) times daily for 7 days. Take with food  Dispense: 14 tablet; Refill: 0  2. Acute cough  - benzonatate (TESSALON) 100 MG capsule; Take 1 capsule (100 mg total) by mouth 3 (three) times daily as needed.  Dispense: 30 capsule; Refill: 0  3. Mild intermittent asthma with acute exacerbation  - albuterol (VENTOLIN HFA) 108 (90 Base) MCG/ACT inhaler; Inhale 2 puffs into the lungs every 6 (six) hours as needed for wheezing or shortness of breath.  Dispense: 8 g; Refill: 0     Follow Up  Instructions: I discussed the assessment and treatment plan with the patient. The patient was provided an opportunity to ask questions and all were answered. The patient agreed with the plan and demonstrated an understanding of the instructions.  A copy of instructions were sent to the patient via MyChart unless otherwise noted below.    The patient was advised to call back or seek an in-person evaluation if the symptoms worsen or if the condition fails to improve as anticipated.  Time:  I spent 15 minutes with the patient via telehealth technology discussing the above problems/concerns.    Katherine Simas, FNP

## 2023-02-06 ENCOUNTER — Encounter: Payer: Self-pay | Admitting: Neurology

## 2023-02-08 ENCOUNTER — Ambulatory Visit: Payer: 59 | Admitting: Neurology

## 2023-02-21 ENCOUNTER — Other Ambulatory Visit: Payer: Self-pay

## 2023-02-21 ENCOUNTER — Other Ambulatory Visit (HOSPITAL_BASED_OUTPATIENT_CLINIC_OR_DEPARTMENT_OTHER): Payer: Self-pay

## 2023-02-26 ENCOUNTER — Other Ambulatory Visit (HOSPITAL_BASED_OUTPATIENT_CLINIC_OR_DEPARTMENT_OTHER): Payer: Self-pay

## 2023-02-26 ENCOUNTER — Other Ambulatory Visit: Payer: Self-pay

## 2023-02-26 MED ORDER — TOPIRAMATE 25 MG PO TABS
25.0000 mg | ORAL_TABLET | Freq: Two times a day (BID) | ORAL | 0 refills | Status: DC
  Filled 2023-02-26: qty 30, 15d supply, fill #0

## 2023-02-26 NOTE — Telephone Encounter (Signed)
Per Dr.Jaffe, If she is agreeable, send prescription for topiramate 25mg  at bedtime.  Numbness and tingling may be a side effect but will improve when she gets used to the medication.  Women of childbearing age should take precautions not to get pregnant.  We can increase dose in 4 weeks if needed.

## 2023-03-08 ENCOUNTER — Other Ambulatory Visit (HOSPITAL_BASED_OUTPATIENT_CLINIC_OR_DEPARTMENT_OTHER): Payer: Self-pay

## 2023-03-29 ENCOUNTER — Telehealth (HOSPITAL_BASED_OUTPATIENT_CLINIC_OR_DEPARTMENT_OTHER): Payer: Self-pay

## 2023-03-29 ENCOUNTER — Ambulatory Visit (INDEPENDENT_AMBULATORY_CARE_PROVIDER_SITE_OTHER): Payer: 59 | Admitting: Family Medicine

## 2023-03-29 ENCOUNTER — Other Ambulatory Visit (HOSPITAL_BASED_OUTPATIENT_CLINIC_OR_DEPARTMENT_OTHER): Payer: Self-pay

## 2023-03-29 ENCOUNTER — Encounter (HOSPITAL_BASED_OUTPATIENT_CLINIC_OR_DEPARTMENT_OTHER): Payer: Self-pay | Admitting: Family Medicine

## 2023-03-29 VITALS — BP 137/84 | HR 82

## 2023-03-29 DIAGNOSIS — N390 Urinary tract infection, site not specified: Secondary | ICD-10-CM

## 2023-03-29 DIAGNOSIS — R3915 Urgency of urination: Secondary | ICD-10-CM

## 2023-03-29 LAB — POCT URINALYSIS DIPSTICK
Bilirubin, UA: NEGATIVE
Glucose, UA: NEGATIVE
Ketones, UA: NEGATIVE
Nitrite, UA: NEGATIVE
Protein, UA: POSITIVE — AB
Spec Grav, UA: 1.025 (ref 1.010–1.025)
Urobilinogen, UA: NEGATIVE E.U./dL — AB
pH, UA: 6.5 (ref 5.0–8.0)

## 2023-03-29 MED ORDER — NITROFURANTOIN MONOHYD MACRO 100 MG PO CAPS
100.0000 mg | ORAL_CAPSULE | Freq: Two times a day (BID) | ORAL | 0 refills | Status: AC
Start: 2023-03-29 — End: 2023-04-03
  Filled 2023-03-29: qty 10, 5d supply, fill #0

## 2023-03-29 MED ORDER — FLUCONAZOLE 150 MG PO TABS
150.0000 mg | ORAL_TABLET | Freq: Once | ORAL | 0 refills | Status: AC
Start: 2023-03-29 — End: 2023-03-30
  Filled 2023-03-29: qty 1, 1d supply, fill #0

## 2023-03-29 NOTE — Telephone Encounter (Signed)
I was given permission to go inside patient chart to schedule office visit with Provider KB, FNP.

## 2023-04-02 ENCOUNTER — Encounter: Payer: Self-pay | Admitting: Physician Assistant

## 2023-04-02 ENCOUNTER — Other Ambulatory Visit (HOSPITAL_BASED_OUTPATIENT_CLINIC_OR_DEPARTMENT_OTHER): Payer: Self-pay

## 2023-04-02 LAB — POCT URINALYSIS DIP (CLINITEK)
Blood, UA: NEGATIVE
Glucose, UA: NEGATIVE mg/dL
Ketones, POC UA: NEGATIVE mg/dL
Nitrite, UA: NEGATIVE
POC PROTEIN,UA: 30 — AB
Spec Grav, UA: >=1.03 — AB (ref 1.010–1.025)
Urobilinogen, UA: 0.2 E.U./dL
pH, UA: 7 (ref 5.0–8.0)

## 2023-04-02 MED ORDER — SULFAMETHOXAZOLE-TRIMETHOPRIM 800-160 MG PO TABS
1.0000 | ORAL_TABLET | Freq: Two times a day (BID) | ORAL | 0 refills | Status: AC
  Filled 2023-04-02: qty 20, 10d supply, fill #0

## 2023-04-02 NOTE — Progress Notes (Signed)
I was given permission to go inside patient's chart by patient herself. POCT UA completed, reviewed and diagnosed with acute UTI on 03/29/2023. Treated with Macrobid 100mg  BID x 5 days. Patient still experiencing symptoms on 04/02/2023. Reviewed POCT UA- continued UTI. Will prescribe Bactrim.

## 2023-04-08 IMAGING — DX DG TOE 5TH 2+V*L*
3 series · 3 of 3 positions shown · non-contrast
Comparison: None Available.

CLINICAL DATA: Stubbed left toe.

EXAM:
DG TOE 5TH LEFT

[toe ap]
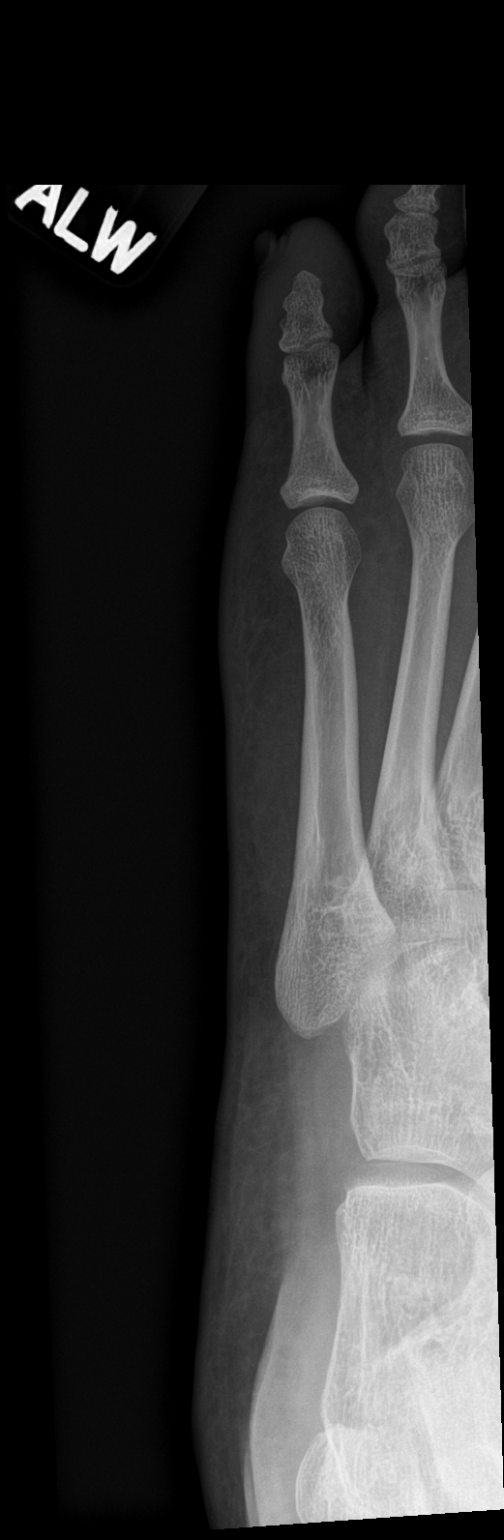

[toe obl]
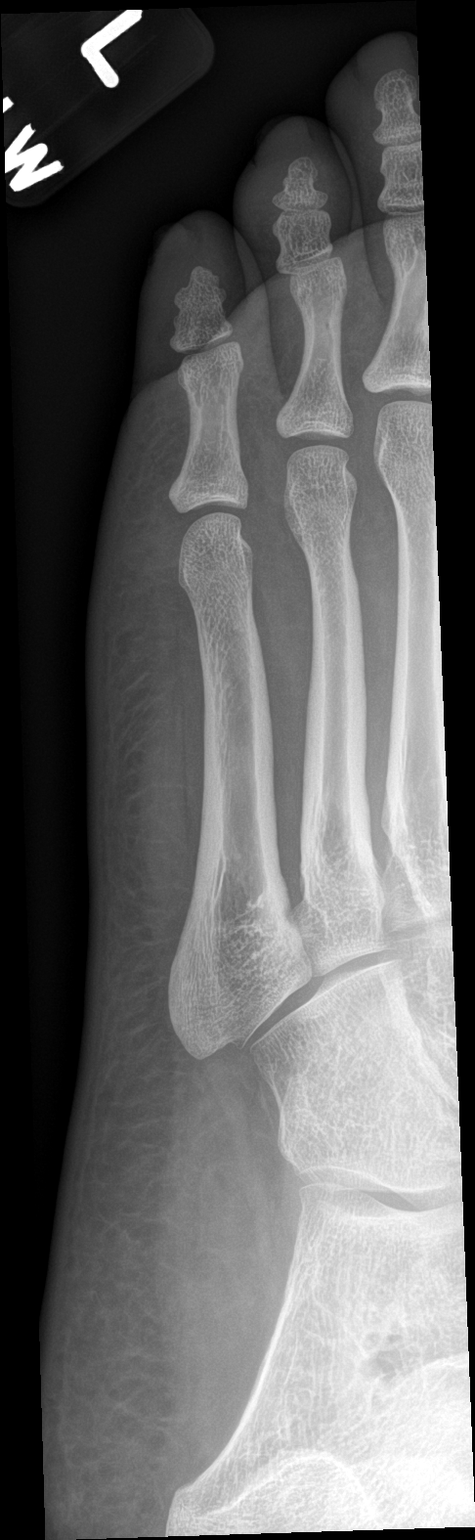

[toe lat]
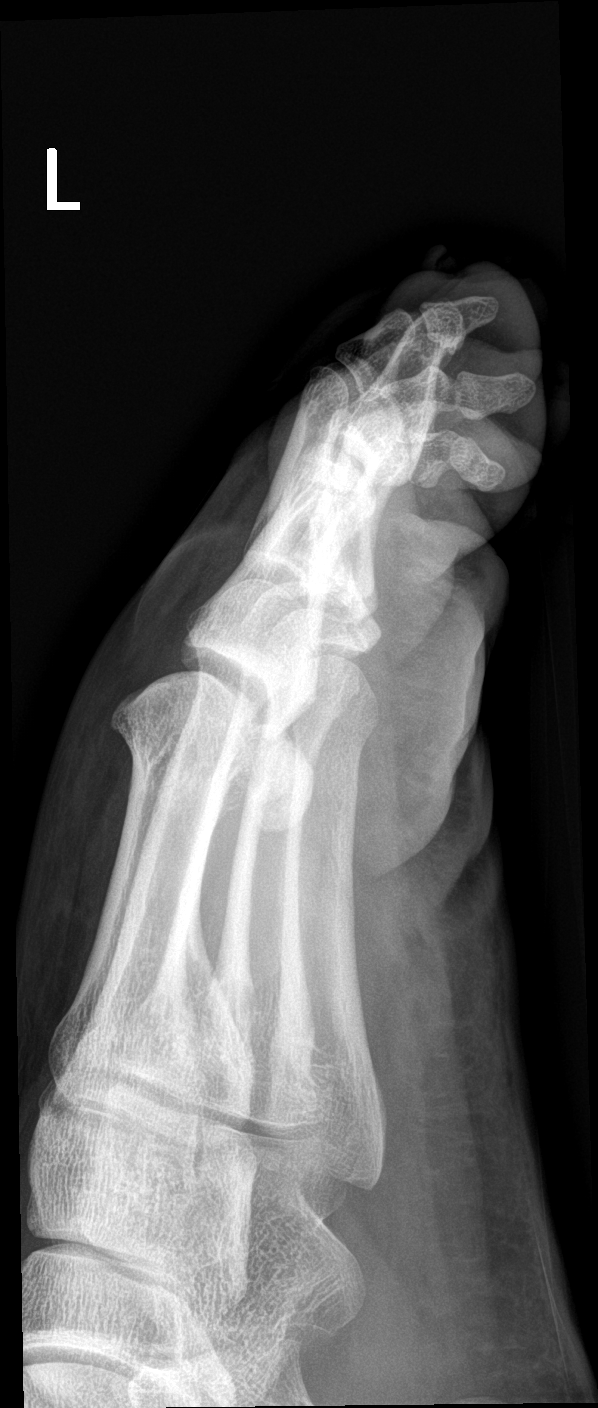

[3 of 3 positions shown; findings below may reference images not displayed]

FINDINGS: There is no evidence of fracture or dislocation. There is no
evidence of arthropathy or other focal bone abnormality. Soft
tissues are unremarkable.
IMPRESSION: Negative.

## 2023-04-12 ENCOUNTER — Other Ambulatory Visit (HOSPITAL_BASED_OUTPATIENT_CLINIC_OR_DEPARTMENT_OTHER): Payer: Self-pay | Admitting: Family Medicine

## 2023-04-12 ENCOUNTER — Other Ambulatory Visit (HOSPITAL_BASED_OUTPATIENT_CLINIC_OR_DEPARTMENT_OTHER): Payer: Self-pay

## 2023-04-12 MED ORDER — FLUCONAZOLE 150 MG PO TABS
150.0000 mg | ORAL_TABLET | Freq: Once | ORAL | 0 refills | Status: AC
  Filled 2023-04-12: qty 1, 1d supply, fill #0

## 2023-04-12 NOTE — Progress Notes (Signed)
I had permission from the patient to go into her chart to send another dose of diflucan. She is still experiencing yeast symptoms after completing course of antibiotics. Sent to pharmacy on file.

## 2023-04-20 ENCOUNTER — Encounter: Payer: Self-pay | Admitting: Physician Assistant

## 2023-04-27 ENCOUNTER — Encounter: Payer: 59 | Admitting: Physician Assistant

## 2023-05-02 ENCOUNTER — Other Ambulatory Visit (HOSPITAL_BASED_OUTPATIENT_CLINIC_OR_DEPARTMENT_OTHER): Payer: Self-pay

## 2023-05-02 ENCOUNTER — Ambulatory Visit (INDEPENDENT_AMBULATORY_CARE_PROVIDER_SITE_OTHER): Payer: 59 | Admitting: Family Medicine

## 2023-05-02 ENCOUNTER — Encounter (HOSPITAL_BASED_OUTPATIENT_CLINIC_OR_DEPARTMENT_OTHER): Payer: Self-pay | Admitting: Family Medicine

## 2023-05-02 VITALS — BP 143/100 | HR 55 | Resp 18

## 2023-05-02 DIAGNOSIS — Z0000 Encounter for general adult medical examination without abnormal findings: Secondary | ICD-10-CM

## 2023-05-02 DIAGNOSIS — F419 Anxiety disorder, unspecified: Secondary | ICD-10-CM

## 2023-05-02 DIAGNOSIS — D696 Thrombocytopenia, unspecified: Secondary | ICD-10-CM | POA: Diagnosis not present

## 2023-05-02 DIAGNOSIS — G43109 Migraine with aura, not intractable, without status migrainosus: Secondary | ICD-10-CM

## 2023-05-02 DIAGNOSIS — I10 Essential (primary) hypertension: Secondary | ICD-10-CM

## 2023-05-02 MED ORDER — AMLODIPINE BESYLATE 5 MG PO TABS
5.0000 mg | ORAL_TABLET | Freq: Every day | ORAL | 3 refills | Status: DC
Start: 2023-05-02 — End: 2024-03-14
  Filled 2023-05-02: qty 90, 90d supply, fill #0
  Filled 2023-11-13: qty 30, 30d supply, fill #1
  Filled 2023-12-11 – 2024-01-08 (×2): qty 30, 30d supply, fill #2
  Filled 2024-02-06: qty 30, 30d supply, fill #3
  Filled 2024-03-05 – 2024-03-14 (×2): qty 30, 30d supply, fill #4

## 2023-05-02 MED ORDER — HYDROCHLOROTHIAZIDE 12.5 MG PO CAPS
12.5000 mg | ORAL_CAPSULE | Freq: Every day | ORAL | 1 refills | Status: DC
Start: 2023-05-02 — End: 2023-10-28
  Filled 2023-05-02: qty 90, 90d supply, fill #0

## 2023-05-02 MED ORDER — FLUOXETINE HCL 10 MG PO CAPS
10.0000 mg | ORAL_CAPSULE | Freq: Every day | ORAL | 0 refills | Status: DC
Start: 2023-05-02 — End: 2023-06-27
  Filled 2023-05-02: qty 60, 60d supply, fill #0

## 2023-05-02 NOTE — Progress Notes (Unsigned)
Complete physical exam  Patient: Katherine Parrish   DOB: 2000/11/08   21 y.o. Female  MRN: [id number]  Subjective:   Katherine Parrish is a 22 y.o. female who presents today for a complete physical exam. She reports consuming a {diet types:17450} diet. {types:19826} She generally feels {DESC; WELL/FAIRLY WELL/POORLY:18703}. She reports sleeping {DESC; WELL/FAIRLY WELL/POORLY:18703}. She {does/does not:200015} have additional problems to discuss today.   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  More decreased mood- a couple of months  Denies difficulty sleeping     Mother- uterine cancer and was a big stressor- she was primary caregiver     Depression screenings:    05/02/2023    4:47 PM 03/31/2022   10:17 AM 12/14/2021   10:25 AM  Depression screen PHQ 2/9  Decreased Interest 2 0 3  Down, Depressed, Hopeless 0 0 2  PHQ - 2 Score 2 0 5  Altered sleeping 0  3  Tired, decreased energy 3  3  Change in appetite 0  2  Feeling bad or failure about yourself  1  2  Trouble concentrating 1  2  Moving slowly or fidgety/restless 0  0  Suicidal thoughts 0  0  PHQ-9 Score 7  17  Difficult doing work/chores Somewhat difficult  Somewhat difficult    Anxiety screenings:    05/02/2023    4:48 PM 12/14/2021   10:24 AM 08/23/2021   12:15 PM 06/03/2021    2:40 PM  GAD 7 : Generalized Anxiety Score  Nervous, Anxious, on Edge 3 0 2 3  Control/stop worrying 3 0 2 2  Worry too much - different things 3 2 3 3   Trouble relaxing 2 1 1  0  Restless 2 0 0 0  Easily annoyed or irritable 2 0 3 3  Afraid - awful might happen 3 0 1 3  Total GAD 7 Score 18 3 12 14   Anxiety Difficulty Very difficult Not difficult at all Very difficult Somewhat difficult     {VISON DENTAL STD PSA (Optional):27386}  Patient Care Team: Alyson Reedy, FNP as PCP - General (Family Medicine) Thomasene Ripple, DO as PCP - Cardiology (Cardiology)   Outpatient Medications Prior to Visit  Medication Sig   albuterol (VENTOLIN HFA) 108 (90 Base) MCG/ACT inhaler Inhale 2 puffs into the lungs every 6 (six) hours as needed for wheezing or shortness of breath.   montelukast (SINGULAIR) 10 MG tablet Take 1 tablet (10 mg total) by mouth at bedtime.   ondansetron (ZOFRAN-ODT) 4 MG disintegrating tablet Take 1 tablet (4 mg total) by mouth every 8 (eight) hours as needed for nausea or vomiting.   rizatriptan (MAXALT-MLT) 10 MG disintegrating tablet Take 1 tablet (10 mg total) by mouth as needed for migraine. May repeat in 2 hours if needed.  Maximum 2 tablets in 24 hours.   [DISCONTINUED] amLODipine (NORVASC) 5 MG tablet Take 1 tablet (5 mg total) by mouth daily.   [DISCONTINUED] benzonatate (TESSALON) 100 MG capsule Take 1 capsule (100 mg total) by mouth 3 (three) times daily as needed.   [DISCONTINUED] fluticasone (FLONASE) 50 MCG/ACT nasal spray Place 2 sprays into both nostrils daily.   [DISCONTINUED] hydrochlorothiazide (MICROZIDE) 12.5 MG capsule Take 1 capsule (12.5 mg total) by mouth daily.   [DISCONTINUED] meclizine (ANTIVERT) 12.5 MG tablet Take 1 tablet (  12.5 mg total) by mouth 3 (three) times daily as needed for dizziness.   [DISCONTINUED] nortriptyline (PAMELOR) 10 MG capsule Take 1 capsule (10 mg total) by mouth at bedtime.   [DISCONTINUED] tiZANidine (ZANAFLEX) 2 MG tablet Take 1 - 2 tablets (2 - 4 mg total) by mouth at bedtime as needed for muscle spasms.   [DISCONTINUED] topiramate (TOPAMAX) 25 MG tablet Take 1 tablet (25 mg total) by mouth 2 (two) times daily.   No facility-administered medications prior to visit.    ROS     Objective:     BP (!) 143/100 (BP Location: Left Arm, Patient Position: Sitting)   Pulse (!) 55   Resp 18   SpO2 100%  BP Readings from Last 3 Encounters:  05/02/23 (!) 143/100  03/29/23 137/84   01/29/23 128/75     Physical Exam      Assessment & Plan:    Routine Health Maintenance and Physical Exam  Health Maintenance  Topic Date Due   Flu Shot  04/19/2023   Pap Smear  11/02/2024   Pap Smear  11/02/2024   DTaP/Tdap/Td vaccine (8 - Td or Tdap) 12/23/2030   HPV Vaccine  Completed   Hepatitis C Screening  Completed   HIV Screening  Completed   COVID-19 Vaccine  Discontinued    Wellness examination -     CBC with Differential/Platelet; Future -     Comprehensive metabolic panel; Future -     Hemoglobin A1c; Future -     Lipid panel; Future -     TSH Rfx on Abnormal to Free T4; Future  Anxiety -     FLUoxetine HCl; Take 1 capsule (10 mg total) by mouth daily.  Dispense: 60 capsule; Refill: 0  Primary hypertension -     amLODIPine Besylate; Take 1 tablet (5 mg total) by mouth daily.  Dispense: 90 tablet; Refill: 3 -     hydroCHLOROthiazide; Take 1 capsule (12.5 mg total) by mouth daily.  Dispense: 90 capsule; Refill: 1  Migraine with aura and without status migrainosus, not intractable     Return in about 4 weeks (around 05/30/2023) for Mood f/u, HTN follow-up.     Alyson Reedy, FNP

## 2023-05-03 ENCOUNTER — Encounter (HOSPITAL_BASED_OUTPATIENT_CLINIC_OR_DEPARTMENT_OTHER): Payer: Self-pay | Admitting: Family Medicine

## 2023-05-03 DIAGNOSIS — F419 Anxiety disorder, unspecified: Secondary | ICD-10-CM | POA: Insufficient documentation

## 2023-05-03 DIAGNOSIS — I10 Essential (primary) hypertension: Secondary | ICD-10-CM | POA: Insufficient documentation

## 2023-05-03 DIAGNOSIS — Z0000 Encounter for general adult medical examination without abnormal findings: Secondary | ICD-10-CM | POA: Diagnosis not present

## 2023-05-03 DIAGNOSIS — G43109 Migraine with aura, not intractable, without status migrainosus: Secondary | ICD-10-CM | POA: Insufficient documentation

## 2023-05-03 DIAGNOSIS — Z79899 Other long term (current) drug therapy: Secondary | ICD-10-CM | POA: Insufficient documentation

## 2023-05-03 LAB — CBC WITH DIFFERENTIAL/PLATELET
Basophils Absolute: 0 10*3/uL (ref 0.0–0.2)
Basos: 1 %
EOS (ABSOLUTE): 0.2 10*3/uL (ref 0.0–0.4)
Eos: 4 %
Hematocrit: 37.3 % (ref 34.0–46.6)
Hemoglobin: 11.9 g/dL (ref 11.1–15.9)
Immature Grans (Abs): 0 10*3/uL (ref 0.0–0.1)
Immature Granulocytes: 0 %
Lymphocytes Absolute: 1.7 10*3/uL (ref 0.7–3.1)
Lymphs: 44 %
MCH: 27.8 pg (ref 26.6–33.0)
MCHC: 31.9 g/dL (ref 31.5–35.7)
MCV: 87 fL (ref 79–97)
Monocytes Absolute: 0.4 10*3/uL (ref 0.1–0.9)
Monocytes: 9 %
Neutrophils Absolute: 1.6 10*3/uL (ref 1.4–7.0)
Neutrophils: 42 %
Platelets: 160 10*3/uL (ref 150–450)
RBC: 4.28 x10E6/uL (ref 3.77–5.28)
RDW: 14 % (ref 11.7–15.4)
WBC: 3.8 10*3/uL (ref 3.4–10.8)

## 2023-05-03 LAB — COMPREHENSIVE METABOLIC PANEL
ALT: 9 IU/L (ref 0–32)
AST: 17 IU/L (ref 0–40)
Albumin: 4.3 g/dL (ref 4.0–5.0)
Alkaline Phosphatase: 71 IU/L (ref 44–121)
BUN/Creatinine Ratio: 17 (ref 9–23)
BUN: 13 mg/dL (ref 6–20)
Bilirubin Total: 0.3 mg/dL (ref 0.0–1.2)
CO2: 24 mmol/L (ref 20–29)
Calcium: 9.3 mg/dL (ref 8.7–10.2)
Chloride: 103 mmol/L (ref 96–106)
Creatinine, Ser: 0.78 mg/dL (ref 0.57–1.00)
Globulin, Total: 3.1 g/dL (ref 1.5–4.5)
Glucose: 89 mg/dL (ref 70–99)
Potassium: 4 mmol/L (ref 3.5–5.2)
Sodium: 141 mmol/L (ref 134–144)
Total Protein: 7.4 g/dL (ref 6.0–8.5)
eGFR: 110 mL/min/{1.73_m2} (ref >59)

## 2023-05-03 LAB — TSH RFX ON ABNORMAL TO FREE T4: TSH: 2.01 u[IU]/mL (ref 0.450–4.500)

## 2023-05-03 LAB — LIPID PANEL
Chol/HDL Ratio: 4.1 ratio (ref 0.0–4.4)
Cholesterol, Total: 220 mg/dL — ABNORMAL HIGH (ref 100–199)
HDL: 54 mg/dL (ref >39)
LDL Chol Calc (NIH): 151 mg/dL — ABNORMAL HIGH (ref 0–99)
Triglycerides: 84 mg/dL (ref 0–149)
VLDL Cholesterol Cal: 15 mg/dL (ref 5–40)

## 2023-05-03 LAB — HEMOGLOBIN A1C
Est. average glucose Bld gHb Est-mCnc: 100 mg/dL
Hgb A1c MFr Bld: 5.1 % (ref 4.8–5.6)

## 2023-05-03 NOTE — Assessment & Plan Note (Signed)
Patient has a history of thrombocytopenia that developed during her pregnancy. Last CBC assessed on 01/17/2023 with no acute anemia and normal platelet count. Will obtain CBC with labs today and update patient with results.

## 2023-05-03 NOTE — Assessment & Plan Note (Signed)
Routine HCM labs ordered. Will obtain fasting labs tomorrow morning and update patient with results.  Review of PMH, FH, SH, medications and HM performed. Preventative care hand-out provided.  Recommend healthy diet.  Recommend approximately 150 minutes/week of moderate intensity exercise. Recommend regular dental and vision exams. Always use seatbelt/lap and shoulder restraints. Recommend using smoke alarms and checking batteries at least twice a year. Recommend using sunscreen when outside. Discussed immunization recommendations for influenza vaccine. Patient plans to get within the next month.  Pap is UTD.

## 2023-05-03 NOTE — Patient Instructions (Signed)

## 2023-05-03 NOTE — Assessment & Plan Note (Signed)
Patient presents today with elevated blood pressure. Patient in no acute distress and is well-appearing. Denies chest pain, shortness of breath, lower extremity edema, vision changes, headaches. Cardiovascular exam with heart regular rate and rhythm. Normal heart sounds, no murmurs present. No lower extremity edema present. Lungs clear to auscultation bilaterally. Patient is currently taking amlodipine 5mg  daily and hydrochlorothiazide 12.5mg  daily. She reports she has not been 100% compliant with her medication. Counseled patient regarding taking medication as prescribed. Refills provided today. Follow-up in 4 weeks.

## 2023-05-03 NOTE — Assessment & Plan Note (Signed)
Patient presents today with concerns for anxiety. She reports that she has been taking sertraline inconsistently for the past couple of years and has increased her dose of sertraline to 125mg  daily. However, she did not notice a difference in her mood with increase in dose adjustments. PHQ9 completed with score of 7. GAD7 completed with score of 18. Discussed medication options with patient, reasonable to change to different SSRI medication. Agreeable to try fluoxetine 10mg  daily for 2 weeks. As long no adverse symptoms are present, advised her that we can increase to 20mg  daily after the first 2 weeks. Educated patient that it may take 6-8 weeks to notice an improvement in mood. She would like to be referred for counseling services. Referral placed.

## 2023-05-18 ENCOUNTER — Encounter: Payer: 59 | Admitting: Physician Assistant

## 2023-05-28 ENCOUNTER — Other Ambulatory Visit (HOSPITAL_BASED_OUTPATIENT_CLINIC_OR_DEPARTMENT_OTHER): Payer: Self-pay

## 2023-05-28 ENCOUNTER — Other Ambulatory Visit (HOSPITAL_BASED_OUTPATIENT_CLINIC_OR_DEPARTMENT_OTHER): Payer: Self-pay | Admitting: Family Medicine

## 2023-05-28 DIAGNOSIS — B9689 Other specified bacterial agents as the cause of diseases classified elsewhere: Secondary | ICD-10-CM

## 2023-05-28 MED ORDER — FLUCONAZOLE 150 MG PO TABS
ORAL_TABLET | ORAL | 0 refills | Status: DC
Start: 2023-05-28 — End: 2023-06-22
  Filled 2023-05-28: qty 2, 3d supply, fill #0

## 2023-05-28 MED ORDER — METRONIDAZOLE 500 MG PO TABS
500.0000 mg | ORAL_TABLET | Freq: Three times a day (TID) | ORAL | 0 refills | Status: AC
Start: 2023-05-28 — End: 2023-06-04
  Filled 2023-05-28: qty 21, 7d supply, fill #0

## 2023-05-30 ENCOUNTER — Ambulatory Visit: Payer: 59 | Admitting: Neurology

## 2023-06-04 ENCOUNTER — Other Ambulatory Visit (HOSPITAL_BASED_OUTPATIENT_CLINIC_OR_DEPARTMENT_OTHER): Payer: Self-pay

## 2023-06-06 ENCOUNTER — Encounter: Payer: Self-pay | Admitting: Neurology

## 2023-06-06 ENCOUNTER — Other Ambulatory Visit: Payer: Self-pay | Admitting: Neurology

## 2023-06-06 ENCOUNTER — Other Ambulatory Visit (HOSPITAL_BASED_OUTPATIENT_CLINIC_OR_DEPARTMENT_OTHER): Payer: Self-pay

## 2023-06-06 MED ORDER — SUMATRIPTAN SUCCINATE 100 MG PO TABS
100.0000 mg | ORAL_TABLET | Freq: Once | ORAL | 5 refills | Status: DC | PRN
Start: 2023-06-06 — End: 2023-10-28
  Filled 2023-06-06: qty 9, 30d supply, fill #0

## 2023-06-06 MED ORDER — TOPIRAMATE 25 MG PO TABS
25.0000 mg | ORAL_TABLET | Freq: Every day | ORAL | 5 refills | Status: DC
  Filled 2023-06-06: qty 30, 30d supply, fill #0

## 2023-06-08 ENCOUNTER — Other Ambulatory Visit (HOSPITAL_BASED_OUTPATIENT_CLINIC_OR_DEPARTMENT_OTHER): Payer: Self-pay

## 2023-06-21 ENCOUNTER — Other Ambulatory Visit (HOSPITAL_BASED_OUTPATIENT_CLINIC_OR_DEPARTMENT_OTHER): Payer: Self-pay | Admitting: Family Medicine

## 2023-06-21 DIAGNOSIS — R399 Unspecified symptoms and signs involving the genitourinary system: Secondary | ICD-10-CM

## 2023-06-21 DIAGNOSIS — N898 Other specified noninflammatory disorders of vagina: Secondary | ICD-10-CM | POA: Diagnosis not present

## 2023-06-22 ENCOUNTER — Other Ambulatory Visit (HOSPITAL_BASED_OUTPATIENT_CLINIC_OR_DEPARTMENT_OTHER): Payer: Self-pay

## 2023-06-22 ENCOUNTER — Other Ambulatory Visit (HOSPITAL_BASED_OUTPATIENT_CLINIC_OR_DEPARTMENT_OTHER): Payer: Self-pay | Admitting: Family Medicine

## 2023-06-22 DIAGNOSIS — B3731 Acute candidiasis of vulva and vagina: Secondary | ICD-10-CM

## 2023-06-22 DIAGNOSIS — B9689 Other specified bacterial agents as the cause of diseases classified elsewhere: Secondary | ICD-10-CM

## 2023-06-22 MED ORDER — CLOTRIMAZOLE 2 % VA CREA
1.0000 | TOPICAL_CREAM | Freq: Every day | VAGINAL | 2 refills | Status: DC
  Filled 2023-06-22: qty 21, fill #0
  Filled 2024-04-23: qty 21, 3d supply, fill #0

## 2023-06-22 MED ORDER — FLUCONAZOLE 150 MG PO TABS
ORAL_TABLET | ORAL | 3 refills | Status: DC
  Filled 2023-06-22: qty 2, 3d supply, fill #0
  Filled 2023-07-18 – 2023-08-30 (×2): qty 2, 3d supply, fill #1

## 2023-06-22 MED ORDER — AMOXICILLIN-POT CLAVULANATE 875-125 MG PO TABS
1.0000 | ORAL_TABLET | Freq: Two times a day (BID) | ORAL | 0 refills | Status: DC
Start: 2023-06-22 — End: 2023-12-05
  Filled 2023-06-22: qty 14, 7d supply, fill #0

## 2023-06-25 ENCOUNTER — Other Ambulatory Visit (HOSPITAL_BASED_OUTPATIENT_CLINIC_OR_DEPARTMENT_OTHER): Payer: Self-pay | Admitting: Family Medicine

## 2023-06-25 ENCOUNTER — Other Ambulatory Visit: Payer: Self-pay | Admitting: Family Medicine

## 2023-06-25 DIAGNOSIS — N6325 Unspecified lump in the left breast, overlapping quadrants: Secondary | ICD-10-CM

## 2023-06-25 DIAGNOSIS — N6022 Fibroadenosis of left breast: Secondary | ICD-10-CM

## 2023-06-25 LAB — CT, NG, MYCOPLASMAS NAA, URINE
Chlamydia trachomatis, NAA: NEGATIVE
Mycoplasma genitalium NAA: POSITIVE — AB
Mycoplasma hominis NAA: POSITIVE — AB
Neisseria gonorrhoeae, NAA: NEGATIVE
Ureaplasma spp NAA: POSITIVE — AB

## 2023-06-25 LAB — NUSWAB VAGINITIS PLUS (VG+)
Candida albicans, NAA: POSITIVE — AB
Candida glabrata, NAA: NEGATIVE
Chlamydia trachomatis, NAA: NEGATIVE
Neisseria gonorrhoeae, NAA: NEGATIVE
Trich vag by NAA: NEGATIVE

## 2023-06-25 LAB — URINE CULTURE

## 2023-06-26 ENCOUNTER — Other Ambulatory Visit (HOSPITAL_BASED_OUTPATIENT_CLINIC_OR_DEPARTMENT_OTHER): Payer: Self-pay | Admitting: Family Medicine

## 2023-06-26 ENCOUNTER — Other Ambulatory Visit (HOSPITAL_BASED_OUTPATIENT_CLINIC_OR_DEPARTMENT_OTHER): Payer: Self-pay

## 2023-06-26 DIAGNOSIS — A493 Mycoplasma infection, unspecified site: Secondary | ICD-10-CM

## 2023-06-26 MED ORDER — DOXYCYCLINE HYCLATE 100 MG PO TABS
100.0000 mg | ORAL_TABLET | Freq: Two times a day (BID) | ORAL | 0 refills | Status: AC
Start: 2023-06-26 — End: 2023-07-05
  Filled 2023-06-26: qty 14, 7d supply, fill #0

## 2023-06-27 ENCOUNTER — Other Ambulatory Visit (HOSPITAL_BASED_OUTPATIENT_CLINIC_OR_DEPARTMENT_OTHER): Payer: Self-pay | Admitting: Family Medicine

## 2023-06-27 ENCOUNTER — Other Ambulatory Visit (HOSPITAL_BASED_OUTPATIENT_CLINIC_OR_DEPARTMENT_OTHER): Payer: Self-pay

## 2023-06-27 DIAGNOSIS — F419 Anxiety disorder, unspecified: Secondary | ICD-10-CM

## 2023-06-27 MED ORDER — FLUOXETINE HCL 20 MG PO CAPS
20.0000 mg | ORAL_CAPSULE | Freq: Every day | ORAL | 3 refills | Status: DC
Start: 2023-06-27 — End: 2023-08-13
  Filled 2023-06-27: qty 30, 30d supply, fill #0

## 2023-07-13 ENCOUNTER — Ambulatory Visit
Admission: RE | Admit: 2023-07-13 | Discharge: 2023-07-13 | Disposition: A | Discharge status: Home / Self Care | Payer: 59 | Source: Ambulatory Visit | Attending: Family Medicine | Admitting: Family Medicine

## 2023-07-13 DIAGNOSIS — N6325 Unspecified lump in the left breast, overlapping quadrants: Secondary | ICD-10-CM

## 2023-07-13 DIAGNOSIS — N6322 Unspecified lump in the left breast, upper inner quadrant: Secondary | ICD-10-CM | POA: Diagnosis not present

## 2023-07-16 ENCOUNTER — Other Ambulatory Visit (HOSPITAL_BASED_OUTPATIENT_CLINIC_OR_DEPARTMENT_OTHER): Payer: Self-pay

## 2023-07-16 MED ORDER — FLULAVAL 0.5 ML IM SUSY
0.5000 mL | PREFILLED_SYRINGE | Freq: Once | INTRAMUSCULAR | 0 refills | Status: AC
  Filled 2023-07-16: qty 0.5, 1d supply, fill #0

## 2023-07-18 ENCOUNTER — Other Ambulatory Visit (HOSPITAL_BASED_OUTPATIENT_CLINIC_OR_DEPARTMENT_OTHER): Payer: Self-pay

## 2023-07-20 DIAGNOSIS — N926 Irregular menstruation, unspecified: Secondary | ICD-10-CM | POA: Diagnosis not present

## 2023-07-25 ENCOUNTER — Other Ambulatory Visit (HOSPITAL_BASED_OUTPATIENT_CLINIC_OR_DEPARTMENT_OTHER): Payer: Self-pay

## 2023-08-02 ENCOUNTER — Other Ambulatory Visit (HOSPITAL_BASED_OUTPATIENT_CLINIC_OR_DEPARTMENT_OTHER): Payer: Self-pay | Admitting: Family Medicine

## 2023-08-06 ENCOUNTER — Other Ambulatory Visit (HOSPITAL_BASED_OUTPATIENT_CLINIC_OR_DEPARTMENT_OTHER): Payer: Self-pay | Admitting: Family Medicine

## 2023-08-06 DIAGNOSIS — D696 Thrombocytopenia, unspecified: Secondary | ICD-10-CM | POA: Diagnosis not present

## 2023-08-06 LAB — CBC WITH DIFFERENTIAL/PLATELET
Basophils Absolute: 0 10*3/uL (ref 0.0–0.2)
Basos: 1 %
EOS (ABSOLUTE): 0.2 10*3/uL (ref 0.0–0.4)
Eos: 3 %
Hematocrit: 35 % (ref 34.0–46.6)
Hemoglobin: 11 g/dL — ABNORMAL LOW (ref 11.1–15.9)
Immature Grans (Abs): 0 10*3/uL (ref 0.0–0.1)
Immature Granulocytes: 0 %
Lymphocytes Absolute: 2.4 10*3/uL (ref 0.7–3.1)
Lymphs: 39 %
MCH: 27.2 pg (ref 26.6–33.0)
MCHC: 31.4 g/dL — ABNORMAL LOW (ref 31.5–35.7)
MCV: 87 fL (ref 79–97)
Monocytes Absolute: 0.5 10*3/uL (ref 0.1–0.9)
Monocytes: 8 %
Neutrophils Absolute: 3 10*3/uL (ref 1.4–7.0)
Neutrophils: 49 %
Platelets: 150 10*3/uL (ref 150–450)
RBC: 4.04 x10E6/uL (ref 3.77–5.28)
RDW: 15 % (ref 11.7–15.4)
WBC: 6.1 10*3/uL (ref 3.4–10.8)

## 2023-08-06 NOTE — Progress Notes (Signed)
Patient has history of thrombocytopenia. Reasonable to repeat her CBC today. Order placed for labs.

## 2023-08-09 ENCOUNTER — Encounter (HOSPITAL_BASED_OUTPATIENT_CLINIC_OR_DEPARTMENT_OTHER): Payer: Self-pay | Admitting: Family Medicine

## 2023-08-10 NOTE — Progress Notes (Deleted)
NEUROLOGY FOLLOW UP OFFICE NOTE  Katherine Parrish [id number]  Assessment/Plan:   Chronic migraine with aura, with status migrainosus, not intractable  Migraine prevention:  start nortriptyline 10mg  at bedtime.  We can increase dose to 25mg  at bedtime in 4 weeks if needed. Migraine rescue:  Stop all OTC analgesics.  Try Maxalt-MLT 10mg  with Zofran ODT 4mg .  Use meclizine sparingly Limit use of pain relievers to no more than 2 days out of week to prevent risk of rebound or medication-overuse headache. Keep headache diary Follow up 6 months.    Subjective:  Katherine Parrish is a 22 year old female with HTN, thrombocytopenia and asthma who follows up for migraines   UPDATE: Nortriptyline negatively affected her mood.  Changed to topiramate.  Rizatriptan made her nauseous.  Switched to sumatriptan. Intensity:  *** Duration:  *** Frequency:  *** Frequency of abortive medication: *** Current NSAIDS/analgesics:  Tylenol, ibuprofen, naproxen, BC powder Current triptans:  sumatriptan 100mg  Current ergotamine: Maxalt-MLT 10mg  Current anti-emetic:  Zofran ODT 4mg  Current muscle relaxants:  tizanidine 2-4mg  QHS PRN (muscle spasms) Current Antihypertensive medications:  amlodipine, HCTZ Current Antidepressant medications:  none Current Anticonvulsant medications:  topiramate *** Current anti-CGRP:  none Current Vitamins/Herbal/Supplements:  none Current Antihistamines/Decongestants:  meclizine, Flonase Other therapy:  none Birth control:  none   Caffeine:  tries to avoid caffeine Diet:  32 oz water daily.  No soda.  Does not skip meals. Exercise:  no Depression:  none; Anxiety:  none Other pain:  no Sleep hygiene:  improved.  HISTORY: History of migraines for several years.  Used to last a day and occur every few weeks.  They started to increase after she had her son in 2022.  After she gave birth, she had an IUD implanted.  They became more frequent.  She had her IUD removed in August  2023 which was minimally helpful.  At that time, she had several ED visits.  She was most recently seen in the ED on 01/17/2023, where CT head performed was normal.  They occur 15 days a month on average.  Most recently, her migraine lasted 2 1/2 weeks.  When it ended, her period started.  Migraines are 7/10 right frontal pressure with left retro-orbital pressure associated with dizziness, nausea, photophobia and osmophobia.  No associated visual disturbance.  Quick head movements and positional changes aggravate dizziness.  Rest helps with headaches.  Takes multiple over the counter NSAIDs and analgesics 3 days a week.   afterwards   Past NSAIDS/analgesics:  naproxen 500mg  Past abortive triptans:  rizatriptan 10mg  Past abortive ergotamine:  none Past muscle relaxants:  none Past anti-emetic:  Zofran ODT 8mg , Compazine Past antihypertensive medications:  none Past antidepressant medications:  nortriptyline (affected mood), sertraline Past anticonvulsant medications:  none Past anti-CGRP:  Bernita Raisin *** Past vitamins/Herbal/Supplements:  none Past antihistamines/decongestants:  none Other past therapies:  none   Family history of headache:  no  PAST MEDICAL HISTORY: Past Medical History:  Diagnosis Date   Asthma    Chronic hypertension with superimposed pre-eclampsia 03/14/2021   Hypertension    Vaginal delivery 03/15/2021    MEDICATIONS: Current Outpatient Medications on File Prior to Visit  Medication Sig Dispense Refill   albuterol (VENTOLIN HFA) 108 (90 Base) MCG/ACT inhaler Inhale 2 puffs into the lungs every 6 (six) hours as needed for wheezing or shortness of breath. 6.7 g 0   amLODipine (NORVASC) 5 MG tablet Take 1 tablet (5 mg total) by mouth daily. 90 tablet 3  clotrimazole (GYNE-LOTRIMIN 3) 2 % vaginal cream Place 1 Applicatorful vaginally at bedtime. 21 g 2   fluconazole (DIFLUCAN) 150 MG tablet Take 1 tablet by mouth after completing antibiotic. Within 72 hours, if  symptoms still present, take the second tablet. 2 tablet 3   FLUoxetine (PROZAC) 20 MG capsule Take 1 capsule (20 mg total) by mouth daily. 90 capsule 3   hydrochlorothiazide (MICROZIDE) 12.5 MG capsule Take 1 capsule (12.5 mg total) by mouth daily. 90 capsule 1   montelukast (SINGULAIR) 10 MG tablet Take 1 tablet (10 mg total) by mouth at bedtime. 90 tablet 3   ondansetron (ZOFRAN-ODT) 4 MG disintegrating tablet Take 1 tablet (4 mg total) by mouth every 8 (eight) hours as needed for nausea or vomiting. 20 tablet 5   SUMAtriptan (IMITREX) 100 MG tablet Take 1 tablet (100 mg total) by mouth once as needed for up to 1 dose for migraine. May repeat in 2 hours if headache persists or recurs.  Maximum 2 tablets in 24 hours. 10 tablet 5   topiramate (TOPAMAX) 25 MG tablet Take 1 tablet (25 mg total) by mouth at bedtime. 30 tablet 5   [DISCONTINUED] Fluticasone Propionate, Inhal, 100 MCG/ACT AEPB Inhale 1 puff into the lungs in the morning and at bedtime. 60 each 1   No current facility-administered medications on file prior to visit.    ALLERGIES: Allergies  Allergen Reactions   Aspirin Nausea Only    FAMILY HISTORY: Family History  Problem Relation Age of Onset   Uterine cancer Mother 21       leiomyosarcoma; d. 3   Hypertension Mother    Thyroid disease Mother    Anxiety disorder Mother    Diabetes Mellitus II Father    Breast cancer Maternal Grandmother        dx 30s/40s   Cancer Paternal Grandfather        dx 10s; unknown primary; ? lung   Colon cancer Neg Hx       Objective:  *** General: No acute distress.  Patient appears ***-groomed.   Head:  Normocephalic/atraumatic Eyes:  Fundi examined but not visualized Neck: supple, no paraspinal tenderness, full range of motion Heart:  Regular rate and rhythm Lungs:  Clear to auscultation bilaterally Back: No paraspinal tenderness Neurological Exam: alert and oriented.  Speech fluent and not dysarthric, language intact.  CN II-XII  intact. Bulk and tone normal, muscle strength 5/5 throughout.  Sensation to light touch intact.  Deep tendon reflexes 2+ throughout, toes downgoing.  Finger to nose testing intact.  Gait normal, Romberg negative.   Shon Millet, DO  CC: ***

## 2023-08-13 ENCOUNTER — Other Ambulatory Visit (HOSPITAL_BASED_OUTPATIENT_CLINIC_OR_DEPARTMENT_OTHER): Payer: Self-pay | Admitting: Family Medicine

## 2023-08-13 ENCOUNTER — Ambulatory Visit: Payer: 59 | Admitting: Neurology

## 2023-08-13 DIAGNOSIS — F419 Anxiety disorder, unspecified: Secondary | ICD-10-CM

## 2023-08-13 DIAGNOSIS — F339 Major depressive disorder, recurrent, unspecified: Secondary | ICD-10-CM

## 2023-08-13 MED ORDER — ESCITALOPRAM OXALATE 5 MG PO TABS
ORAL_TABLET | ORAL | 0 refills | Status: DC
Start: 2023-08-13 — End: 2023-09-13
  Filled 2023-08-13: qty 45, 30d supply, fill #0

## 2023-08-13 NOTE — Progress Notes (Signed)
Patient reports no improvement in mood with Prozac. Also reports an increase in feelings of apathy, more fatigue, increase in irritability, and difficulty concentrating. She would like to trial different SSRI medication. She has been on Zoloft (after having her son) and Prozac (most recently). Discussed medication management with patient. Based on shared decision-making will trial Lexapro, planning on increasing from 5mg  to 10mg  in two weeks, if tolerated well. Patient verbalized understanding and is agreeable to this plan.

## 2023-08-14 ENCOUNTER — Other Ambulatory Visit (HOSPITAL_BASED_OUTPATIENT_CLINIC_OR_DEPARTMENT_OTHER): Payer: Self-pay

## 2023-08-14 ENCOUNTER — Other Ambulatory Visit: Payer: Self-pay

## 2023-08-31 ENCOUNTER — Other Ambulatory Visit: Payer: Self-pay

## 2023-09-10 DIAGNOSIS — N924 Excessive bleeding in the premenopausal period: Secondary | ICD-10-CM | POA: Diagnosis not present

## 2023-09-11 ENCOUNTER — Other Ambulatory Visit (HOSPITAL_BASED_OUTPATIENT_CLINIC_OR_DEPARTMENT_OTHER): Payer: Self-pay

## 2023-09-13 ENCOUNTER — Other Ambulatory Visit (HOSPITAL_BASED_OUTPATIENT_CLINIC_OR_DEPARTMENT_OTHER): Payer: Self-pay | Admitting: Family Medicine

## 2023-09-13 ENCOUNTER — Ambulatory Visit: Payer: 59

## 2023-09-13 ENCOUNTER — Other Ambulatory Visit (HOSPITAL_BASED_OUTPATIENT_CLINIC_OR_DEPARTMENT_OTHER): Payer: Self-pay

## 2023-09-13 DIAGNOSIS — N760 Acute vaginitis: Secondary | ICD-10-CM | POA: Diagnosis not present

## 2023-09-13 DIAGNOSIS — Z113 Encounter for screening for infections with a predominantly sexual mode of transmission: Secondary | ICD-10-CM | POA: Diagnosis not present

## 2023-09-13 DIAGNOSIS — F339 Major depressive disorder, recurrent, unspecified: Secondary | ICD-10-CM

## 2023-09-13 DIAGNOSIS — F419 Anxiety disorder, unspecified: Secondary | ICD-10-CM

## 2023-09-13 DIAGNOSIS — B3732 Chronic candidiasis of vulva and vagina: Secondary | ICD-10-CM | POA: Diagnosis not present

## 2023-09-13 MED ORDER — NYSTATIN-TRIAMCINOLONE 100000-0.1 UNIT/GM-% EX CREA
TOPICAL_CREAM | CUTANEOUS | 0 refills | Status: DC
  Filled 2023-09-13: qty 30, 30d supply, fill #0

## 2023-09-13 MED ORDER — FLUCONAZOLE 150 MG PO TABS
150.0000 mg | ORAL_TABLET | ORAL | 0 refills | Status: DC
  Filled 2023-09-13: qty 2, 4d supply, fill #0

## 2023-09-13 MED ORDER — ESCITALOPRAM OXALATE 5 MG PO TABS
ORAL_TABLET | ORAL | 0 refills | Status: DC
  Filled 2023-09-13: qty 45, fill #0

## 2023-09-13 MED ORDER — ESCITALOPRAM OXALATE 10 MG PO TABS
10.0000 mg | ORAL_TABLET | Freq: Every day | ORAL | 3 refills | Status: DC
  Filled 2023-09-13: qty 90, 90d supply, fill #0
  Filled 2023-09-13: qty 30, 30d supply, fill #0

## 2023-10-15 ENCOUNTER — Other Ambulatory Visit (HOSPITAL_COMMUNITY): Payer: Self-pay

## 2023-10-15 ENCOUNTER — Other Ambulatory Visit: Payer: Self-pay | Admitting: Family Medicine

## 2023-10-15 MED ORDER — FLUCONAZOLE 150 MG PO TABS
ORAL_TABLET | ORAL | 5 refills | Status: DC
  Filled 2023-10-15: qty 2, 3d supply, fill #0
  Filled 2023-12-11 – 2024-02-06 (×2): qty 2, 3d supply, fill #1
  Filled 2024-03-12: qty 2, 3d supply, fill #2
  Filled 2024-04-23: qty 2, 3d supply, fill #3
  Filled 2024-05-23: qty 2, 3d supply, fill #4

## 2023-10-15 NOTE — Progress Notes (Unsigned)
NEUROLOGY FOLLOW UP OFFICE NOTE  Katherine Parrish [id number]  Assessment/Plan:   Menstrual migraine with aura, with status migrainosus, not intractable Tension-type headache, not intractable  Perimenstrual prophylaxis:  take Nurtec one daily for 8 days beginning 1 day prior to first day of migraine (the 15th of the month) - samples provided Migraine rescue:  Zofran ODT 4mg .  Use meclizine sparingly.  Consider another triptan (eletriptan) if needed. Limit use of pain relievers to no more than 2 days out of week to prevent risk of rebound or medication-overuse headache. Keep headache diary Follow up 6 months.    Subjective:  Katherine Parrish is a 23 year old female with HTN, thrombocytopenia and asthma who follows up for migraines.  UPDATE: Started nortriptyline.  Made her irritable, so she was switched to topiramate.  She never started the topiramate. Maxalt-MLT and Bernita Raisin were ineffective.  Sumatriptan ineffective  She tends to have mild tension type headaches (pressure in the temples without associated symptoms) about 3 to 4 days a month, lasts 3-4 hours.  May treat with ibuprofen.  However, migraines seem to be purely menstrual.  Her menstrual cycle starts on the 18th.  Migraines begin on the 16th and last for total of 5 days.  Nothing seems to break them.   Current NSAIDS/analgesics:  ibuprofen Current triptans:  sumatriptan 100mg  Current ergotamine:  none Current anti-emetic:  none Current muscle relaxants:  tizanidine 2-4mg  QHS PRN (muscle spasms) Current Antihypertensive medications:  amlodipine, HCTZ Current Antidepressant medications:  none Current Anticonvulsant medications:  none Current anti-CGRP:  none Current Vitamins/Herbal/Supplements:  none Current Antihistamines/Decongestants:  meclizine, Flonase Other therapy:  none Birth control:  none   Caffeine:  tries to avoid caffeine Diet:  32 oz water daily.  No soda.  Does not skip meals. Exercise:  no Depression:   none; Anxiety:  none Other pain:  no Sleep hygiene:  improved.  HISTORY: History of migraines for several years.  Used to last a day and occur every few weeks.  They started to increase after she had her son in 2022.  After she gave birth, she had an IUD implanted.  They became more frequent.  She had her IUD removed in August 2023 which was minimally helpful.  At that time, she had several ED visits.  She was most recently seen in the ED on 01/17/2023, where CT head performed was normal.  They occur 15 days a month on average.  Most recently, her migraine lasted 2 1/2 weeks.  When it ended, her period started.  Migraines are 7/10 right frontal pressure with left retro-orbital pressure associated with dizziness, nausea, photophobia and osmophobia.  No associated visual disturbance.  Quick head movements and positional changes aggravate dizziness.  Rest helps with headaches.  Takes multiple over the counter NSAIDs and analgesics 3 days a week.   afterwards   Past NSAIDS/analgesics:  naproxen 500mg , Tylenol, ibuprofen, naproxen, BC powder Past abortive triptans:  rizatriptan Past abortive ergotamine:  none Past muscle relaxants:  none Past anti-emetic:  Zofran ODT 8mg , Compazine Past antihypertensive medications:  none Past antidepressant medications:  nortriptyline (irritability), sertraline Past anticonvulsant medications:  none Past anti-CGRP:  Ubrelvy 100mg  Past vitamins/Herbal/Supplements:  none Past antihistamines/decongestants:  none Other past therapies:  none   Family history of headache:  no  PAST MEDICAL HISTORY: Past Medical History:  Diagnosis Date   Asthma    Chronic hypertension with superimposed pre-eclampsia 03/14/2021   Hypertension    Vaginal delivery 03/15/2021    MEDICATIONS: Current  Outpatient Medications on File Prior to Visit  Medication Sig Dispense Refill   albuterol (VENTOLIN HFA) 108 (90 Base) MCG/ACT inhaler Inhale 2 puffs into the lungs every 6 (six)  hours as needed for wheezing or shortness of breath. 6.7 g 0   amLODipine (NORVASC) 5 MG tablet Take 1 tablet (5 mg total) by mouth daily. 90 tablet 3   clotrimazole (GYNE-LOTRIMIN 3) 2 % vaginal cream Place 1 Applicatorful vaginally at bedtime. 21 g 2   escitalopram (LEXAPRO) 10 MG tablet Take 1 tablet (10 mg total) by mouth daily. 90 tablet 3   fluconazole (DIFLUCAN) 150 MG tablet Take 1 tablet by mouth after completing antibiotic. Within 72 hours, if symptoms still present, take the second tablet. 2 tablet 3   fluconazole (DIFLUCAN) 150 MG tablet Take 1 tablet (150 mg total) by mouth stat. May repeat in 3 days if needed 2 tablet 0   hydrochlorothiazide (MICROZIDE) 12.5 MG capsule Take 1 capsule (12.5 mg total) by mouth daily. 90 capsule 1   montelukast (SINGULAIR) 10 MG tablet Take 1 tablet (10 mg total) by mouth at bedtime. 90 tablet 3   nystatin-triamcinolone (MYCOLOG II) cream APPLY TO THE AFFECTED AREA(S) BY TOPICAL ROUTE 2 TIMES PER DAY IN THEMORNING AND EVENING 30 g 0   ondansetron (ZOFRAN-ODT) 4 MG disintegrating tablet Take 1 tablet (4 mg total) by mouth every 8 (eight) hours as needed for nausea or vomiting. 20 tablet 5   SUMAtriptan (IMITREX) 100 MG tablet Take 1 tablet (100 mg total) by mouth once as needed for up to 1 dose for migraine. May repeat in 2 hours if headache persists or recurs.  Maximum 2 tablets in 24 hours. 10 tablet 5   topiramate (TOPAMAX) 25 MG tablet Take 1 tablet (25 mg total) by mouth at bedtime. 30 tablet 5   [DISCONTINUED] Fluticasone Propionate, Inhal, 100 MCG/ACT AEPB Inhale 1 puff into the lungs in the morning and at bedtime. 60 each 1   No current facility-administered medications on file prior to visit.    ALLERGIES: Allergies  Allergen Reactions   Aspirin Nausea Only    FAMILY HISTORY: Family History  Problem Relation Age of Onset   Uterine cancer Mother 33       leiomyosarcoma; d. 56   Hypertension Mother    Thyroid disease Mother    Anxiety  disorder Mother    Diabetes Mellitus II Father    Breast cancer Maternal Grandmother        dx 30s/40s   Cancer Paternal Grandfather        dx 51s; unknown primary; ? lung   Colon cancer Neg Hx       Objective:  Blood pressure 124/71, pulse 75, height 5\' 7"  (1.702 m), weight 222 lb 12.8 oz (101.1 kg), SpO2 98%. General: No acute distress.  Patient appears well-groomed.      Shon Millet, DO  CC: Alyson Reedy, FNP

## 2023-10-16 ENCOUNTER — Encounter: Payer: Self-pay | Admitting: Neurology

## 2023-10-16 ENCOUNTER — Ambulatory Visit: Payer: 59 | Admitting: Neurology

## 2023-10-16 VITALS — BP 124/71 | HR 75 | Ht 67.0 in | Wt 222.8 lb

## 2023-10-16 DIAGNOSIS — G44219 Episodic tension-type headache, not intractable: Secondary | ICD-10-CM

## 2023-10-16 DIAGNOSIS — G43839 Menstrual migraine, intractable, without status migrainosus: Secondary | ICD-10-CM

## 2023-10-16 NOTE — Progress Notes (Signed)
Medication Samples have been provided to the patient.  Drug name: Nurtec       Strength: 75 mg        Qty: 4  LOT: [id number]  Exp.Date: 5/26  Dosing instructions: daily due menstrual cycle   The patient has been instructed regarding the correct time, dose, and frequency of taking this medication, including desired effects and most common side effects.   Leida Lauth 9:09 AM 10/16/2023

## 2023-10-16 NOTE — Patient Instructions (Signed)
Take one Nurtec daily for 8 days beginning 1 day before start of headaches (on the 15th of the month).  Let me know if it is effective and you want a prescription Zofran as needed. Limit use of pain relievers to no more than 2 days out of week to prevent risk of rebound or medication-overuse headache. Keep headache diary

## 2023-10-25 ENCOUNTER — Other Ambulatory Visit (HOSPITAL_BASED_OUTPATIENT_CLINIC_OR_DEPARTMENT_OTHER): Payer: Self-pay

## 2023-10-25 MED ORDER — FLUCONAZOLE 150 MG PO TABS
150.0000 mg | ORAL_TABLET | Freq: Once | ORAL | 0 refills | Status: DC
  Filled 2023-10-25: qty 1, 1d supply, fill #0

## 2023-10-28 ENCOUNTER — Other Ambulatory Visit: Payer: Self-pay

## 2023-10-28 ENCOUNTER — Ambulatory Visit
Admission: EM | Admit: 2023-10-28 | Discharge: 2023-10-28 | Disposition: A | Discharge status: Home / Self Care | Payer: 59 | Attending: Internal Medicine | Admitting: Internal Medicine

## 2023-10-28 DIAGNOSIS — J45901 Unspecified asthma with (acute) exacerbation: Secondary | ICD-10-CM | POA: Diagnosis not present

## 2023-10-28 DIAGNOSIS — R051 Acute cough: Secondary | ICD-10-CM | POA: Diagnosis not present

## 2023-10-28 DIAGNOSIS — J101 Influenza due to other identified influenza virus with other respiratory manifestations: Secondary | ICD-10-CM | POA: Diagnosis not present

## 2023-10-28 LAB — POC COVID19/FLU A&B COMBO
Covid Antigen, POC: NEGATIVE
Influenza A Antigen, POC: POSITIVE — AB
Influenza B Antigen, POC: NEGATIVE

## 2023-10-28 MED ORDER — PREDNISONE 20 MG PO TABS
40.0000 mg | ORAL_TABLET | Freq: Every day | ORAL | 0 refills | Status: DC
  Filled 2023-10-28: qty 10, 5d supply, fill #0

## 2023-10-28 MED ORDER — OSELTAMIVIR PHOSPHATE 75 MG PO CAPS
75.0000 mg | ORAL_CAPSULE | Freq: Two times a day (BID) | ORAL | 0 refills | Status: DC
  Filled 2023-10-28: qty 10, 5d supply, fill #0

## 2023-10-28 MED ORDER — OSELTAMIVIR PHOSPHATE 75 MG PO CAPS: 75.0000 mg | ORAL_CAPSULE | Freq: Two times a day (BID) | ORAL | 0 refills | Status: AC

## 2023-10-28 MED ORDER — PREDNISONE 20 MG PO TABS: 40.0000 mg | ORAL_TABLET | Freq: Every day | ORAL | 0 refills | Status: AC

## 2023-10-28 MED ORDER — BENZONATATE 200 MG PO CAPS
200.0000 mg | ORAL_CAPSULE | Freq: Three times a day (TID) | ORAL | 0 refills | Status: DC | PRN
  Filled 2023-10-28: qty 15, 5d supply, fill #0

## 2023-10-28 MED ORDER — METHYLPREDNISOLONE ACETATE 80 MG/ML IJ SUSP
40.0000 mg | Freq: Once | INTRAMUSCULAR | Status: AC
  Administered 2023-10-28: 40 mg via INTRAMUSCULAR

## 2023-10-28 MED ORDER — BENZONATATE 200 MG PO CAPS: 200.0000 mg | ORAL_CAPSULE | Freq: Three times a day (TID) | ORAL | 0 refills | Status: DC | PRN

## 2023-10-28 NOTE — ED Provider Notes (Signed)
 [facility]  Note:  This document was prepared using Dragon voice recognition software and may include unintentional dictation errors.  MRN: [id number] DOB: Mar 12, 2001 DATE: 10/28/23   Subjective:  Chief Complaint:  Chief Complaint  Patient presents with   Cough   Sore Throat     HPI: Katherine Parrish is a 23 y.o. female presenting for cough and congestion for the past 4 days. Patient states she started with a cough and congestion on Wednesday. Reports ongoing dry cough. She states that she has also been having a sore throat and bilateral otalgia. She has been using her inhaler every 4 hours with improvement for wheezing. She has also been using OTC cold and flu medicine and sudafed with no improvement. Reports multiple sick contacts. Denies fever, nausea/vomiting, abdominal pain. Endorses sore throat, cough, congestion, myalgias, chills. Presents NAD.  Prior to Admission medications   Medication Sig Start Date End Date Taking? Authorizing Provider  albuterol  (VENTOLIN  HFA) 108 (90 Base) MCG/ACT inhaler Inhale 2 puffs into the lungs every 6 (six) hours as needed for wheezing or shortness of breath. 01/31/23   Kennyth Domino, FNP  amLODipine  (NORVASC ) 5 MG tablet Take 1 tablet (5 mg total) by mouth daily. 05/02/23   Towana Small, FNP  clotrimazole  (GYNE-LOTRIMIN  3) 2 % vaginal cream Place 1 Applicatorful vaginally at bedtime. 06/22/23   Towana Small, FNP  fluconazole  (DIFLUCAN ) 150 MG tablet Take 1 tablet by mouth after completing antibiotic. Within 72 hours, if symptoms still present, take the second tablet. 10/15/23   Towana Small, FNP  hydrochlorothiazide  (MICROZIDE ) 12.5 MG capsule Take 1 capsule (12.5 mg total) by mouth daily. Patient not taking: Reported on 10/16/2023 05/02/23   Towana Small, FNP  montelukast  (SINGULAIR ) 10 MG tablet Take 1 tablet (10 mg total) by mouth at bedtime. 11/16/22   Job Lukes, PA  nystatin -triamcinolone  (MYCOLOG II) cream APPLY TO THE  AFFECTED AREA(S) BY TOPICAL ROUTE 2 TIMES PER DAY IN [facility] AND EVENING 09/13/23     ondansetron  (ZOFRAN -ODT) 4 MG disintegrating tablet Take 1 tablet (4 mg total) by mouth every 8 (eight) hours as needed for nausea or vomiting. 01/29/23   Skeet Juliene SAUNDERS, DO  SUMAtriptan  (IMITREX ) 100 MG tablet Take 1 tablet (100 mg total) by mouth once as needed for up to 1 dose for migraine. May repeat in 2 hours if headache persists or recurs.  Maximum 2 tablets in 24 hours. Patient not taking: Reported on 10/16/2023 06/06/23   Skeet Juliene SAUNDERS, DO  topiramate  (TOPAMAX ) 25 MG tablet Take 1 tablet (25 mg total) by mouth at bedtime. Patient not taking: Reported on 10/16/2023 06/06/23   Skeet Juliene SAUNDERS, DO  Fluticasone  Propionate, Inhal, 100 MCG/ACT AEPB Inhale 1 puff into the lungs in the morning and at bedtime. 10/05/21 10/06/21  Job Lukes, PA     Allergies  Allergen Reactions   Aspirin Nausea Only    History:   Past Medical History:  Diagnosis Date   Asthma    Chronic hypertension with superimposed pre-eclampsia 03/14/2021   Hypertension    Vaginal delivery 03/15/2021     Past Surgical History:  Procedure Laterality Date   HERNIA REPAIR      Family History  Problem Relation Age of Onset   Uterine cancer Mother 29       leiomyosarcoma; d. 12   Hypertension Mother    Thyroid  disease Mother    Anxiety disorder Mother    Diabetes Mellitus II Father    Breast cancer Maternal Grandmother  dx 30s/40s   Cancer Paternal Grandfather        dx 15s; unknown primary; ? lung   Colon cancer Neg Hx     Social History   Tobacco Use   Smoking status: Never   Smokeless tobacco: Never  Vaping Use   Vaping status: Never Used  Substance Use Topics   Alcohol use: No   Drug use: No    Review of Systems  Constitutional:  Positive for chills and fatigue. Negative for fever.  HENT:  Positive for congestion, ear pain, rhinorrhea and sore throat. Negative for ear discharge.   Respiratory:   Positive for cough, chest tightness and wheezing.   Gastrointestinal:  Negative for abdominal pain, nausea and vomiting.  Musculoskeletal:  Positive for myalgias.     Objective:   Vitals: BP (!) 150/84 (BP Location: Right Arm)   Pulse 91   Temp 98.7 F (37.1 C) (Oral)   Resp 16   LMP 10/06/2023   SpO2 98%   Physical Exam Constitutional:      General: She is not in acute distress.    Appearance: Normal appearance. She is well-developed and overweight. She is not ill-appearing or toxic-appearing.  HENT:     Head: Normocephalic and atraumatic.     Right Ear: Ear canal normal. A middle ear effusion is present.     Left Ear: Ear canal normal. A middle ear effusion is present.     Mouth/Throat:     Pharynx: Oropharynx is clear. Uvula midline. No pharyngeal swelling, oropharyngeal exudate or posterior oropharyngeal erythema.     Tonsils: No tonsillar exudate or tonsillar abscesses.  Cardiovascular:     Rate and Rhythm: Normal rate and regular rhythm.     Heart sounds: Normal heart sounds.  Pulmonary:     Effort: Pulmonary effort is normal.     Breath sounds: Normal breath sounds.     Comments: Clear to auscultation bilaterally  Abdominal:     General: Bowel sounds are normal.     Palpations: Abdomen is soft.     Tenderness: There is no abdominal tenderness.  Skin:    General: Skin is warm and dry.  Neurological:     General: No focal deficit present.     Mental Status: She is alert.  Psychiatric:        Mood and Affect: Mood and affect normal.     Results:  Labs: Results for orders placed or performed during the hospital encounter of 10/28/23 (from the past 24 hours)  POC Covid19/Flu A&B Antigen     Status: Abnormal   Collection Time: 10/28/23  4:07 PM  Result Value Ref Range   Influenza A Antigen, POC Positive (A) Negative   Influenza B Antigen, POC Negative Negative   Covid Antigen, POC Negative Negative    Radiology: No results found.   UC Course/Treatments:   Procedures: Procedures   Medications Ordered in UC: Medications  methylPREDNISolone  acetate (DEPO-MEDROL ) injection 40 mg (40 mg Intramuscular Given 10/28/23 1619)     Assessment and Plan :     ICD-10-CM   1. Influenza A  J10.1     2. Asthma with acute exacerbation, unspecified asthma severity, unspecified whether persistent  J45.901     3. Acute cough  R05.1      Influenza A Afebrile, nontoxic-appearing, NAD. VSS. DDX includes but not limited to: COVID, flu, bronchitis, pneumonia, viral URI Flu was positive today in office.  Given history of asthma and current flare, Tamiflu  75 mg  twice daily was prescribed. Strict ED precautions were given and patient verbalized understanding.  Asthma with acute exacerbation, unspecified asthma severity, unspecified whether persistent Afebrile, nontoxic-appearing, NAD. VSS. DDX includes but not limited to: COVID, flu, bronchitis, pneumonia, viral URI  Flu was positive today in office. Given increased in wheezing and chest tightness, suspect asthma exacerbation. Prednisone  40mg  QD was prescribed. Methylprednisolone  40 mg IM was given today in office. Benzonatate  200mg  TID PRN was prescribed for cough. Strict ED precautions were given and patient verbalized understanding.  Acute Cough Afebrile, nontoxic-appearing, NAD. VSS. DDX includes but not limited to: COVID, flu, bronchitis, pneumonia, viral URI  Flu was positive today in office. Given increased in wheezing and chest tightness, suspect asthma exacerbation. Prednisone  40mg  QD was prescribed. Methylprednisolone  40 mg IM was given today in office. Benzonatate  200mg  TID PRN was prescribed for cough. Strict ED precautions were given and patient verbalized understanding.  ED Discharge Orders          Ordered    oseltamivir  (TAMIFLU ) 75 MG capsule  Every 12 hours,   Status:  Discontinued        10/28/23 1617    benzonatate  (TESSALON ) 200 MG capsule  3 times daily PRN,   Status:  Discontinued         10/28/23 1617    predniSONE  (DELTASONE ) 20 MG tablet  Daily with breakfast,   Status:  Discontinued        10/28/23 1617    benzonatate  (TESSALON ) 200 MG capsule  3 times daily PRN        10/28/23 1617    oseltamivir  (TAMIFLU ) 75 MG capsule  Every 12 hours        10/28/23 1617    predniSONE  (DELTASONE ) 20 MG tablet  Daily with breakfast        10/28/23 1617             PDMP not reviewed this encounter.     Bettina Warn P, PA-C 10/28/23 1622

## 2023-10-28 NOTE — ED Triage Notes (Signed)
 Patient C/O flu like symptoms x one week. C/O fever, chills, body aches, bilateral ear pain and sore throat. Patient states she is using her inhaler every 4 hours. Patient works Producer, television/film/video care.

## 2023-10-28 NOTE — Discharge Instructions (Addendum)
 You are positive for the flu. Influenza is a virus. A prescription (Tamiflu ) was sent to your pharmacy to help with the duration of symptoms. Recommend you rest and increase oral fluids. Tylenol/Ibuprofen  as needed for fevers and aches. It is important for you to pay attention to any new symptoms or worsening of your current condition. Please go directly to the Emergency Department immediately should you have any of the following symptoms: chest pain, shortness of breath or difficulty breathing.   You were given an injection (methylprednisolone ) for your asthma today. This will help with the inflammation and wheezing. I have also sent Prednisone  to your pharmacy. Start this tomorrow given your injection in office today. Finally, I sent cough medicine to your pharmacy as well.

## 2023-11-01 ENCOUNTER — Encounter: Payer: Self-pay | Admitting: Family Medicine

## 2023-11-01 ENCOUNTER — Ambulatory Visit (INDEPENDENT_AMBULATORY_CARE_PROVIDER_SITE_OTHER): Payer: 59

## 2023-11-01 ENCOUNTER — Other Ambulatory Visit: Payer: Self-pay | Admitting: Family Medicine

## 2023-11-01 DIAGNOSIS — J4520 Mild intermittent asthma, uncomplicated: Secondary | ICD-10-CM

## 2023-11-05 ENCOUNTER — Other Ambulatory Visit (HOSPITAL_BASED_OUTPATIENT_CLINIC_OR_DEPARTMENT_OTHER): Payer: Self-pay

## 2023-11-09 ENCOUNTER — Encounter: Payer: Self-pay | Admitting: Family Medicine

## 2023-11-09 DIAGNOSIS — D696 Thrombocytopenia, unspecified: Secondary | ICD-10-CM

## 2023-11-13 ENCOUNTER — Other Ambulatory Visit: Payer: Self-pay | Admitting: Nurse Practitioner

## 2023-11-13 ENCOUNTER — Other Ambulatory Visit (HOSPITAL_BASED_OUTPATIENT_CLINIC_OR_DEPARTMENT_OTHER): Payer: Self-pay

## 2023-11-13 ENCOUNTER — Other Ambulatory Visit (HOSPITAL_COMMUNITY): Payer: Self-pay

## 2023-11-13 DIAGNOSIS — J4521 Mild intermittent asthma with (acute) exacerbation: Secondary | ICD-10-CM

## 2023-11-13 MED ORDER — ESCITALOPRAM OXALATE 10 MG PO TABS
10.0000 mg | ORAL_TABLET | Freq: Every day | ORAL | 0 refills | Status: DC
Start: 2023-11-13 — End: 2023-12-14
  Filled 2023-11-13: qty 30, 30d supply, fill #0

## 2023-11-13 MED ORDER — BUSPIRONE HCL 5 MG PO TABS
5.0000 mg | ORAL_TABLET | Freq: Three times a day (TID) | ORAL | 0 refills | Status: DC
  Filled 2023-11-13: qty 90, 30d supply, fill #0

## 2023-11-16 ENCOUNTER — Other Ambulatory Visit (HOSPITAL_COMMUNITY): Payer: Self-pay

## 2023-11-20 ENCOUNTER — Other Ambulatory Visit: Payer: Self-pay | Admitting: Family Medicine

## 2023-11-20 DIAGNOSIS — R5382 Chronic fatigue, unspecified: Secondary | ICD-10-CM

## 2023-11-20 DIAGNOSIS — L659 Nonscarring hair loss, unspecified: Secondary | ICD-10-CM

## 2023-12-04 ENCOUNTER — Other Ambulatory Visit (HOSPITAL_COMMUNITY): Payer: Self-pay

## 2023-12-04 ENCOUNTER — Other Ambulatory Visit: Payer: Self-pay | Admitting: Family Medicine

## 2023-12-04 DIAGNOSIS — J4520 Mild intermittent asthma, uncomplicated: Secondary | ICD-10-CM

## 2023-12-04 MED ORDER — MONTELUKAST SODIUM 10 MG PO TABS
10.0000 mg | ORAL_TABLET | Freq: Every day | ORAL | 2 refills | Status: DC
  Filled 2023-12-04: qty 30, 30d supply, fill #0
  Filled 2024-01-08: qty 30, 30d supply, fill #1
  Filled 2024-02-06: qty 30, 30d supply, fill #2
  Filled 2024-03-05: qty 30, 30d supply, fill #3

## 2023-12-04 NOTE — Progress Notes (Signed)
 Patient personally reached out requesting refill of Singulair. Refill sent to pharmacy on file.

## 2023-12-05 ENCOUNTER — Other Ambulatory Visit: Payer: Self-pay | Admitting: Family Medicine

## 2023-12-05 ENCOUNTER — Other Ambulatory Visit (HOSPITAL_COMMUNITY): Payer: Self-pay

## 2023-12-05 MED ORDER — AMOXICILLIN-POT CLAVULANATE 875-125 MG PO TABS
1.0000 | ORAL_TABLET | Freq: Two times a day (BID) | ORAL | 0 refills | Status: AC
Start: 2023-12-05 — End: 2023-12-12
  Filled 2023-12-05: qty 14, 7d supply, fill #0

## 2023-12-11 ENCOUNTER — Other Ambulatory Visit (HOSPITAL_COMMUNITY): Payer: Self-pay

## 2023-12-11 MED ORDER — BUSPIRONE HCL 5 MG PO TABS
5.0000 mg | ORAL_TABLET | Freq: Three times a day (TID) | ORAL | 0 refills | Status: DC
  Filled 2023-12-11: qty 90, 30d supply, fill #0

## 2023-12-11 MED ORDER — BUPROPION HCL 75 MG PO TABS
ORAL_TABLET | ORAL | 0 refills | Status: DC
  Filled 2023-12-11: qty 53, 30d supply, fill #0

## 2023-12-14 ENCOUNTER — Ambulatory Visit: Admitting: Family Medicine

## 2023-12-14 ENCOUNTER — Other Ambulatory Visit (HOSPITAL_COMMUNITY): Payer: Self-pay

## 2023-12-14 ENCOUNTER — Other Ambulatory Visit: Payer: Self-pay

## 2023-12-14 ENCOUNTER — Encounter: Payer: Self-pay | Admitting: Family Medicine

## 2023-12-14 VITALS — BP 131/91 | HR 87 | Temp 98.8°F | Ht 67.0 in | Wt 220.0 lb

## 2023-12-14 DIAGNOSIS — F419 Anxiety disorder, unspecified: Secondary | ICD-10-CM

## 2023-12-14 DIAGNOSIS — D508 Other iron deficiency anemias: Secondary | ICD-10-CM

## 2023-12-14 DIAGNOSIS — J4520 Mild intermittent asthma, uncomplicated: Secondary | ICD-10-CM | POA: Diagnosis not present

## 2023-12-14 DIAGNOSIS — D696 Thrombocytopenia, unspecified: Secondary | ICD-10-CM

## 2023-12-14 DIAGNOSIS — G43109 Migraine with aura, not intractable, without status migrainosus: Secondary | ICD-10-CM | POA: Diagnosis not present

## 2023-12-14 DIAGNOSIS — D649 Anemia, unspecified: Secondary | ICD-10-CM | POA: Insufficient documentation

## 2023-12-14 DIAGNOSIS — I10 Essential (primary) hypertension: Secondary | ICD-10-CM

## 2023-12-14 DIAGNOSIS — Z136 Encounter for screening for cardiovascular disorders: Secondary | ICD-10-CM

## 2023-12-14 DIAGNOSIS — Z79899 Other long term (current) drug therapy: Secondary | ICD-10-CM

## 2023-12-14 DIAGNOSIS — Z8349 Family history of other endocrine, nutritional and metabolic diseases: Secondary | ICD-10-CM

## 2023-12-14 DIAGNOSIS — E559 Vitamin D deficiency, unspecified: Secondary | ICD-10-CM

## 2023-12-14 LAB — CBC WITH DIFFERENTIAL/PLATELET
Basophils Absolute: 0 10*3/uL (ref 0.0–0.1)
Basophils Relative: 0.9 % (ref 0.0–3.0)
Eosinophils Absolute: 0.3 10*3/uL (ref 0.0–0.7)
Eosinophils Relative: 6.2 % — ABNORMAL HIGH (ref 0.0–5.0)
HCT: 36 % (ref 36.0–46.0)
Hemoglobin: 11.7 g/dL — ABNORMAL LOW (ref 12.0–15.0)
Lymphocytes Relative: 45.2 % (ref 12.0–46.0)
Lymphs Abs: 2 10*3/uL (ref 0.7–4.0)
MCHC: 32.5 g/dL (ref 30.0–36.0)
MCV: 83.2 fl (ref 78.0–100.0)
Monocytes Absolute: 0.4 10*3/uL (ref 0.1–1.0)
Monocytes Relative: 8.8 % (ref 3.0–12.0)
Neutro Abs: 1.7 10*3/uL (ref 1.4–7.7)
Neutrophils Relative %: 38.9 % — ABNORMAL LOW (ref 43.0–77.0)
Platelets: 156 10*3/uL (ref 150.0–400.0)
RBC: 4.32 Mil/uL (ref 3.87–5.11)
RDW: 17.3 % — ABNORMAL HIGH (ref 11.5–15.5)
WBC: 4.5 10*3/uL (ref 4.0–10.5)

## 2023-12-14 LAB — LIPID PANEL
Cholesterol: 196 mg/dL (ref 0–200)
HDL: 45.4 mg/dL (ref >39.00)
LDL Cholesterol: 134 mg/dL — ABNORMAL HIGH (ref 0–99)
NonHDL: 151.03
Total CHOL/HDL Ratio: 4
Triglycerides: 84 mg/dL (ref 0.0–149.0)
VLDL: 16.8 mg/dL (ref 0.0–40.0)

## 2023-12-14 LAB — COMPREHENSIVE METABOLIC PANEL WITH GFR
ALT: 15 U/L (ref 0–35)
AST: 21 U/L (ref 0–37)
Albumin: 4.4 g/dL (ref 3.5–5.2)
Alkaline Phosphatase: 63 U/L (ref 39–117)
BUN: 14 mg/dL (ref 6–23)
CO2: 28 meq/L (ref 19–32)
Calcium: 9.6 mg/dL (ref 8.4–10.5)
Chloride: 105 meq/L (ref 96–112)
Creatinine, Ser: 0.65 mg/dL (ref 0.40–1.20)
GFR: 123.93 mL/min (ref >60.00)
Glucose, Bld: 87 mg/dL (ref 70–99)
Potassium: 3.5 meq/L (ref 3.5–5.1)
Sodium: 139 meq/L (ref 135–145)
Total Bilirubin: 0.4 mg/dL (ref 0.2–1.2)
Total Protein: 8.2 g/dL (ref 6.0–8.3)

## 2023-12-14 LAB — VITAMIN B12: Vitamin B-12: 336 pg/mL (ref 211–911)

## 2023-12-14 LAB — VITAMIN D 25 HYDROXY (VIT D DEFICIENCY, FRACTURES): VITD: 17.92 ng/mL — ABNORMAL LOW (ref 30.00–100.00)

## 2023-12-14 MED ORDER — AIRSUPRA 90-80 MCG/ACT IN AERO
2.0000 | INHALATION_SPRAY | RESPIRATORY_TRACT | 3 refills | Status: AC | PRN
  Filled 2023-12-14: qty 10.7, 17d supply, fill #0
  Filled 2024-02-06: qty 10.7, 10d supply, fill #0

## 2023-12-14 NOTE — Progress Notes (Signed)
 New Patient Office Visit  Subjective    Patient ID: Katherine Parrish, female    DOB: 07-01-2001  Age: 23 y.o. MRN: [id number]  CC:  Chief Complaint  Patient presents with   Establish Care    \     HPI Katherine Parrish presents to establish care today. Reports compliance with medication regimen.  She is up to date on routine vaccines. She is up to date on routine screenings.  She sees [facility] for GYN needs.  Has hx asthma, HTN, vit D def, anemia, thrombocytopenia, family history of thyroid disease, GAD. She is followed by psychiatry with Recently stopped Lexapro due to SOB and increased heart rate. Has started 75 mg Wellbutrin this week, will increase dose to 115 mg next week. Taking 5 mg BuSpar at night. States this seems to be working better for her. Denies other concerns today. Medical history as outlined below  Outpatient Encounter Medications as of 12/14/2023  Medication Sig   Albuterol-Budesonide (AIRSUPRA) 90-80 MCG/ACT AERO Inhale 2 puffs into the lungs every 4 (four) hours as needed.   levocetirizine (XYZAL) 5 MG tablet Take 5 mg by mouth every evening.   albuterol (VENTOLIN HFA) 108 (90 Base) MCG/ACT inhaler Inhale 2 puffs into the lungs every 6 (six) hours as needed for wheezing or shortness of breath.   amLODipine (NORVASC) 5 MG tablet Take 1 tablet (5 mg total) by mouth daily.   buPROPion (WELLBUTRIN) 75 MG tablet take 1 tablet by mouth every morning x 7 days then 1 tablet every morning and 1 tablet at noon x 7 days, then 2 tablets by mouth every morning until bottle is finished   busPIRone (BUSPAR) 5 MG tablet Take 1 tablet (5 mg total) by mouth 3 (three) times daily with meals.   clotrimazole (GYNE-LOTRIMIN 3) 2 % vaginal cream Place 1 Applicatorful vaginally at bedtime.   fluconazole (DIFLUCAN) 150 MG tablet Take 1 tablet by mouth after completing antibiotic. Within 72 hours, if symptoms still present, take the second tablet.   montelukast (SINGULAIR) 10 MG  tablet Take 1 tablet (10 mg total) by mouth at bedtime.   nystatin-triamcinolone (MYCOLOG II) cream APPLY TO THE AFFECTED AREA(S) BY TOPICAL ROUTE 2 TIMES PER DAY IN THEMORNING AND EVENING   ondansetron (ZOFRAN-ODT) 4 MG disintegrating tablet Take 1 tablet (4 mg total) by mouth every 8 (eight) hours as needed for nausea or vomiting.   [DISCONTINUED] benzonatate (TESSALON) 200 MG capsule Take 1 capsule (200 mg total) by mouth 3 (three) times daily as needed for cough.   [DISCONTINUED] escitalopram (LEXAPRO) 10 MG tablet Take 1 tablet (10 mg total) by mouth daily.   [DISCONTINUED] Fluticasone Propionate, Inhal, 100 MCG/ACT AEPB Inhale 1 puff into the lungs in the morning and at bedtime.   No facility-administered encounter medications on file as of 12/14/2023.    Past Medical History:  Diagnosis Date   Asthma    Chronic hypertension with superimposed pre-eclampsia 03/14/2021   Hypertension    Vaginal delivery 03/15/2021    Past Surgical History:  Procedure Laterality Date   HERNIA REPAIR      Family History  Problem Relation Age of Onset   Uterine cancer Mother 73       leiomyosarcoma; d. 92   Hypertension Mother    Thyroid disease Mother    Anxiety disorder Mother    Diabetes Mellitus II Father    Breast cancer Maternal Grandmother        dx 30s/40s   Cancer  Paternal Grandfather        dx 5s; unknown primary; ? 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   Overall Financial Resource Strain (CARDIA)    Difficulty of Paying Living Expenses: Not very hard  Food Insecurity: No Food Insecurity (03/14/2021)   Received from [facility] visits prior to 11/18/2022., [facility] visits prior to 11/18/2022.   Hunger Vital Sign    Worried About Running Out of Food in the Last Year: Never true    Ran Out of Food in the Last Year: Never true  Transportation Needs: No Transportation Needs (03/14/2021)   Received from [facility] visits prior to 11/18/2022., [facility] visits prior to 11/18/2022.   PRAPARE - Administrator, Civil Service (Medical): No    Lack of Transportation (Non-Medical): No  Physical Activity: Inactive (03/14/2021)   Received from [facility] visits prior to 11/18/2022., [facility] visits prior to 11/18/2022.   Exercise Vital Sign    Days of Exercise per Week: 0 days    Minutes of Exercise per Session: 0 min  Stress: No Stress Concern Present (03/14/2021)   [facility] prior to 11/18/2022., [facility] visits prior to 11/18/2022.   [employer] of Occupational Health - Occupational Stress Questionnaire    Feeling of Stress : Not at all  Social Connections: Unknown (03/14/2021)   Received from [facility] visits prior to 11/18/2022., [facility] visits prior to 11/18/2022.   Social Advertising account executive [NHANES]    Frequency of Communication with Friends and Family: Not on file    Frequency of Social Gatherings with  Friends and Family: Once a week    Attends Religious Services: 1 to 4 times per year    Active Member of [employer] or Organizations: No    Attends Banker Meetings: Never    Marital Status: Never married  Intimate Partner Violence: Not At Risk (03/14/2021)   [facility] prior to 11/18/2022., [facility] visits prior to 11/18/2022.   Humiliation, Afraid, Rape, and Kick questionnaire    Fear of Current or Ex-Partner: No    Emotionally Abused: No    Physically Abused: No    Sexually Abused: No    ROS Per HPI  Objective    BP (!) 131/91 (BP Location: Right Arm, Patient Position: Sitting, Cuff Size: Large)   Pulse 87   Temp 98.8 F (37.1 C) (Temporal)   Ht 5\' 7"  (1.702 m)   Wt 220 lb (99.8 kg)   SpO2 99%   BMI 34.46 kg/m   Physical Exam Vitals and nursing note reviewed.  Constitutional:      General: She is not in acute distress.    Appearance: Normal appearance. She is obese.  HENT:     Head: Normocephalic and atraumatic.     Right Ear: Tympanic membrane and ear canal normal.     Left Ear: Tympanic membrane and ear canal normal.     Nose: Nose normal. No congestion.     Mouth/Throat:     Mouth: Mucous membranes are moist.     Pharynx: Oropharynx is clear.  Eyes:     Extraocular Movements: Extraocular movements intact.     Pupils: Pupils are equal, round, and reactive to light.  Cardiovascular:     Rate and Rhythm: Normal rate and regular rhythm.     Pulses: Normal pulses.     Heart sounds: Normal heart sounds.  Pulmonary:     Effort: Pulmonary effort is normal. No respiratory distress.     Breath sounds: Normal breath sounds. No wheezing, rhonchi or rales.  Musculoskeletal:        General: Normal range of motion.     Cervical back: Normal range of motion.  Lymphadenopathy:     Cervical: No cervical adenopathy.  Neurological:     General: No focal deficit present.     Mental Status: She  is alert and oriented to person, place, and time.  Psychiatric:        Mood and Affect: Mood normal.        Thought Content: Thought content normal.         Assessment & Plan:   Primary hypertension -     CBC with Differential/Platelet -     Comprehensive metabolic panel with GFR  Migraine with aura and without status migrainosus, not intractable -     Iron, TIBC and Ferritin Panel  Mild intermittent asthma without complication -     Airsupra; Inhale 2 puffs into the lungs every 4 (four) hours as needed.  Dispense: 10.7 g; Refill: 3  Anxiety -     VITAMIN D 25 Hydroxy (Vit-D Deficiency, Fractures) -     Vitamin B12  Encounter for screening for cardiovascular disorders Assessment & Plan: Continue current medication regimen. Will draw labs today.  Orders: -     Lipid panel  Vitamin D deficiency -     VITAMIN D 25 Hydroxy (Vit-D Deficiency, Fractures) -     Thyroid Panel With TSH  Thrombocytopenia (HCC) -     CBC with Differential/Platelet  Family history of Graves' disease -     Thyroid Panel With TSH  Other iron deficiency anemia -     Vitamin B12 -     Iron, TIBC and Ferritin Panel  Medication management Assessment & Plan: Continue current medication regimen. Will draw labs today.     Return in about 6 months (around 06/15/2024) for med chk, labs.   Sherald Barge, FNP

## 2023-12-14 NOTE — Patient Instructions (Addendum)
 Welcome to [facility]!  Thank you for choosing [place] for your Primary Care needs.   We offer in person and video appointments for your convenience. You may call our office to schedule appointments, or you may schedule appointments with me through MyChart.   The best way to get in contact with me is via MyChart message. This will get to me faster than a phone call, unless there is an emergency, then please call 911.  The lab is located downstairs in the Sports Medicine building, we also have xray available there.   We are checking labs today, will be in contact with any results that require further attention  Follow up with me in 6 mos for med management, sooner if needed

## 2023-12-15 LAB — THYROID PANEL WITH TSH
Free Thyroxine Index: 2.1 (ref 1.4–3.8)
T3 Uptake: 29 % (ref 22–35)
T4, Total: 7.2 ug/dL (ref 5.1–11.9)
TSH: 1.26 m[IU]/L

## 2023-12-15 LAB — IRON,TIBC AND FERRITIN PANEL
%SAT: 22 % (ref 16–45)
Ferritin: 18 ng/mL (ref 16–154)
Iron: 78 ug/dL (ref 40–190)
TIBC: 361 ug/dL (ref 250–450)

## 2023-12-17 ENCOUNTER — Encounter: Payer: Self-pay | Admitting: Family Medicine

## 2023-12-17 NOTE — Assessment & Plan Note (Signed)
 Continue current medication regimen. Will draw labs today.

## 2023-12-19 ENCOUNTER — Other Ambulatory Visit: Payer: Self-pay | Admitting: Family Medicine

## 2023-12-24 ENCOUNTER — Other Ambulatory Visit (HOSPITAL_COMMUNITY): Payer: Self-pay

## 2023-12-24 ENCOUNTER — Other Ambulatory Visit: Payer: Self-pay | Admitting: Family Medicine

## 2023-12-24 DIAGNOSIS — H66001 Acute suppurative otitis media without spontaneous rupture of ear drum, right ear: Secondary | ICD-10-CM

## 2023-12-24 MED ORDER — VITAMIN D (ERGOCALCIFEROL) 1.25 MG (50000 UNIT) PO CAPS
50000.0000 [IU] | ORAL_CAPSULE | ORAL | 0 refills | Status: DC
  Filled 2023-12-24: qty 4, 28d supply, fill #0
  Filled 2024-04-26: qty 4, 28d supply, fill #1

## 2023-12-24 MED ORDER — CEFDINIR 300 MG PO CAPS
300.0000 mg | ORAL_CAPSULE | Freq: Two times a day (BID) | ORAL | 0 refills | Status: AC
  Filled 2023-12-24: qty 20, 10d supply, fill #0

## 2024-01-05 ENCOUNTER — Other Ambulatory Visit (HOSPITAL_COMMUNITY): Payer: Self-pay

## 2024-01-08 ENCOUNTER — Other Ambulatory Visit: Payer: Self-pay | Admitting: Family Medicine

## 2024-01-08 ENCOUNTER — Other Ambulatory Visit (HOSPITAL_COMMUNITY): Payer: Self-pay

## 2024-01-08 ENCOUNTER — Other Ambulatory Visit: Payer: Self-pay

## 2024-01-08 MED ORDER — BUPROPION HCL ER (XL) 150 MG PO TB24
150.0000 mg | ORAL_TABLET | Freq: Every morning | ORAL | 0 refills | Status: DC
  Filled 2024-01-08: qty 30, 30d supply, fill #0

## 2024-01-09 ENCOUNTER — Other Ambulatory Visit: Payer: Self-pay | Admitting: Family Medicine

## 2024-01-09 ENCOUNTER — Other Ambulatory Visit

## 2024-01-09 DIAGNOSIS — N760 Acute vaginitis: Secondary | ICD-10-CM

## 2024-01-09 DIAGNOSIS — R300 Dysuria: Secondary | ICD-10-CM

## 2024-01-10 LAB — URINE CULTURE: Result:: NO GROWTH

## 2024-01-11 ENCOUNTER — Encounter: Payer: Self-pay | Admitting: Family Medicine

## 2024-01-12 LAB — NUSWAB VAGINITIS PLUS (VG+)
Candida albicans, NAA: NEGATIVE
Candida glabrata, NAA: NEGATIVE
Chlamydia trachomatis, NAA: NEGATIVE
Megasphaera 1: HIGH {score} — AB
Neisseria gonorrhoeae, NAA: NEGATIVE
Trich vag by NAA: NEGATIVE

## 2024-01-14 ENCOUNTER — Other Ambulatory Visit: Payer: Self-pay

## 2024-01-14 ENCOUNTER — Other Ambulatory Visit (HOSPITAL_COMMUNITY): Payer: Self-pay

## 2024-01-14 MED ORDER — METRONIDAZOLE 500 MG PO TABS
500.0000 mg | ORAL_TABLET | Freq: Two times a day (BID) | ORAL | 0 refills | Status: AC
  Filled 2024-01-14: qty 14, 7d supply, fill #0

## 2024-01-17 ENCOUNTER — Other Ambulatory Visit: Payer: Self-pay | Admitting: Family Medicine

## 2024-01-17 ENCOUNTER — Other Ambulatory Visit (HOSPITAL_COMMUNITY): Payer: Self-pay

## 2024-01-17 DIAGNOSIS — Z79899 Other long term (current) drug therapy: Secondary | ICD-10-CM

## 2024-01-17 MED ORDER — BUPROPION HCL ER (SR) 150 MG PO TB12
150.0000 mg | ORAL_TABLET | Freq: Every day | ORAL | 1 refills | Status: DC
  Filled 2024-01-17: qty 30, 30d supply, fill #0

## 2024-01-19 ENCOUNTER — Encounter: Payer: Self-pay | Admitting: Neurology

## 2024-01-21 ENCOUNTER — Other Ambulatory Visit (HOSPITAL_COMMUNITY): Payer: Self-pay

## 2024-01-21 ENCOUNTER — Other Ambulatory Visit: Payer: Self-pay | Admitting: Neurology

## 2024-01-21 MED ORDER — ELETRIPTAN HYDROBROMIDE 40 MG PO TABS
40.0000 mg | ORAL_TABLET | ORAL | 5 refills | Status: DC | PRN
  Filled 2024-01-21: qty 10, 10d supply, fill #0

## 2024-01-21 MED ORDER — PROPRANOLOL HCL ER 60 MG PO CP24
60.0000 mg | ORAL_CAPSULE | Freq: Every day | ORAL | 5 refills | Status: DC
  Filled 2024-01-21: qty 30, 30d supply, fill #0

## 2024-01-22 ENCOUNTER — Other Ambulatory Visit (HOSPITAL_COMMUNITY): Payer: Self-pay

## 2024-01-28 ENCOUNTER — Other Ambulatory Visit (HOSPITAL_COMMUNITY): Payer: Self-pay

## 2024-01-28 MED ORDER — BUPROPION HCL ER (XL) 300 MG PO TB24
300.0000 mg | ORAL_TABLET | Freq: Every morning | ORAL | 0 refills | Status: DC
Start: 2024-01-28 — End: 2024-02-06
  Filled 2024-01-28: qty 4, 4d supply, fill #0

## 2024-01-29 ENCOUNTER — Ambulatory Visit: Payer: 59 | Admitting: Neurology

## 2024-02-05 ENCOUNTER — Other Ambulatory Visit (INDEPENDENT_AMBULATORY_CARE_PROVIDER_SITE_OTHER)

## 2024-02-05 DIAGNOSIS — J4520 Mild intermittent asthma, uncomplicated: Secondary | ICD-10-CM

## 2024-02-05 MED ORDER — METHYLPREDNISOLONE ACETATE 80 MG/ML IJ SUSP
80.0000 mg | Freq: Once | INTRAMUSCULAR | Status: AC
  Administered 2024-02-05: 80 mg via INTRAMUSCULAR

## 2024-02-06 ENCOUNTER — Other Ambulatory Visit (HOSPITAL_COMMUNITY): Payer: Self-pay

## 2024-02-06 ENCOUNTER — Other Ambulatory Visit: Payer: Self-pay | Admitting: Family Medicine

## 2024-02-06 ENCOUNTER — Other Ambulatory Visit: Payer: Self-pay

## 2024-02-06 DIAGNOSIS — Z79899 Other long term (current) drug therapy: Secondary | ICD-10-CM

## 2024-02-06 DIAGNOSIS — B9689 Other specified bacterial agents as the cause of diseases classified elsewhere: Secondary | ICD-10-CM

## 2024-02-06 MED ORDER — AMOXICILLIN-POT CLAVULANATE 875-125 MG PO TABS
1.0000 | ORAL_TABLET | Freq: Two times a day (BID) | ORAL | 0 refills | Status: AC
Start: 2024-02-06 — End: 2024-02-13
  Filled 2024-02-06: qty 14, 7d supply, fill #0

## 2024-02-06 MED ORDER — BUPROPION HCL ER (XL) 300 MG PO TB24
300.0000 mg | ORAL_TABLET | Freq: Every morning | ORAL | 3 refills | Status: DC
  Filled 2024-02-06: qty 30, 30d supply, fill #0

## 2024-02-07 ENCOUNTER — Other Ambulatory Visit (HOSPITAL_COMMUNITY): Payer: Self-pay

## 2024-02-08 NOTE — Progress Notes (Signed)
 Pt received injection w/o complications

## 2024-02-15 ENCOUNTER — Other Ambulatory Visit (HOSPITAL_COMMUNITY): Payer: Self-pay

## 2024-02-15 ENCOUNTER — Other Ambulatory Visit: Payer: Self-pay | Admitting: Family Medicine

## 2024-02-15 DIAGNOSIS — F331 Major depressive disorder, recurrent, moderate: Secondary | ICD-10-CM

## 2024-02-15 MED ORDER — DESVENLAFAXINE SUCCINATE ER 25 MG PO TB24
25.0000 mg | ORAL_TABLET | Freq: Every day | ORAL | 0 refills | Status: DC
  Filled 2024-02-15: qty 30, 30d supply, fill #0

## 2024-02-21 ENCOUNTER — Other Ambulatory Visit (HOSPITAL_COMMUNITY): Payer: Self-pay

## 2024-02-21 ENCOUNTER — Other Ambulatory Visit: Payer: Self-pay | Admitting: Neurology

## 2024-02-21 MED ORDER — NURTEC 75 MG PO TBDP
1.0000 | ORAL_TABLET | Freq: Every day | ORAL | 11 refills | Status: DC | PRN
  Filled 2024-02-21: qty 7, 30d supply, fill #0
  Filled 2024-03-19 – 2024-03-20 (×2): qty 8, 8d supply, fill #0
  Filled 2024-03-20: qty 8, 16d supply, fill #0
  Filled 2024-04-15: qty 8, 16d supply, fill #1
  Filled 2024-05-23: qty 8, 16d supply, fill #2
  Filled 2024-06-24: qty 8, 16d supply, fill #3

## 2024-03-05 ENCOUNTER — Other Ambulatory Visit (HOSPITAL_COMMUNITY): Payer: Self-pay

## 2024-03-07 ENCOUNTER — Other Ambulatory Visit (HOSPITAL_COMMUNITY): Payer: Self-pay

## 2024-03-10 ENCOUNTER — Telehealth: Payer: Self-pay | Admitting: Pharmacist

## 2024-03-10 ENCOUNTER — Other Ambulatory Visit (HOSPITAL_BASED_OUTPATIENT_CLINIC_OR_DEPARTMENT_OTHER): Payer: Self-pay

## 2024-03-10 NOTE — Telephone Encounter (Signed)
 Pharmacy Patient Advocate Encounter  Received notification from [facility] that Prior Authorization for Nurtec 75MG  dispersible tablets has been APPROVED from 03/10/2024 to 03/10/2025   PA #/Case ID/Reference #: [id number]

## 2024-03-12 ENCOUNTER — Other Ambulatory Visit (HOSPITAL_COMMUNITY): Payer: Self-pay

## 2024-03-14 ENCOUNTER — Ambulatory Visit: Admitting: Family Medicine

## 2024-03-14 ENCOUNTER — Telehealth: Payer: Self-pay

## 2024-03-14 ENCOUNTER — Encounter: Payer: Self-pay | Admitting: Family Medicine

## 2024-03-14 ENCOUNTER — Other Ambulatory Visit (HOSPITAL_COMMUNITY): Payer: Self-pay

## 2024-03-14 ENCOUNTER — Other Ambulatory Visit: Payer: Self-pay

## 2024-03-14 VITALS — BP 126/88 | HR 78 | Ht 67.0 in | Wt 211.0 lb

## 2024-03-14 DIAGNOSIS — F331 Major depressive disorder, recurrent, moderate: Secondary | ICD-10-CM | POA: Insufficient documentation

## 2024-03-14 DIAGNOSIS — F902 Attention-deficit hyperactivity disorder, combined type: Secondary | ICD-10-CM | POA: Insufficient documentation

## 2024-03-14 DIAGNOSIS — I10 Essential (primary) hypertension: Secondary | ICD-10-CM

## 2024-03-14 DIAGNOSIS — J4520 Mild intermittent asthma, uncomplicated: Secondary | ICD-10-CM

## 2024-03-14 DIAGNOSIS — F410 Panic disorder [episodic paroxysmal anxiety] without agoraphobia: Secondary | ICD-10-CM | POA: Diagnosis not present

## 2024-03-14 DIAGNOSIS — Z79899 Other long term (current) drug therapy: Secondary | ICD-10-CM

## 2024-03-14 DIAGNOSIS — F411 Generalized anxiety disorder: Secondary | ICD-10-CM | POA: Diagnosis not present

## 2024-03-14 MED ORDER — LISDEXAMFETAMINE DIMESYLATE 10 MG PO CAPS
10.0000 mg | ORAL_CAPSULE | Freq: Every day | ORAL | 0 refills | Status: DC
  Filled 2024-03-14 – 2024-03-17 (×2): qty 30, 30d supply, fill #0

## 2024-03-14 MED ORDER — MONTELUKAST SODIUM 10 MG PO TABS: 10.0000 mg | ORAL_TABLET | Freq: Every day | ORAL | 3 refills | Status: DC

## 2024-03-14 MED ORDER — DESVENLAFAXINE SUCCINATE ER 50 MG PO TB24
50.0000 mg | ORAL_TABLET | Freq: Every day | ORAL | 1 refills | Status: DC
  Filled 2024-03-14: qty 30, 30d supply, fill #0
  Filled 2024-04-15: qty 30, 30d supply, fill #1

## 2024-03-14 MED ORDER — AMLODIPINE BESYLATE 5 MG PO TABS: 5.0000 mg | ORAL_TABLET | Freq: Every day | ORAL | 3 refills | Status: DC

## 2024-03-14 NOTE — Assessment & Plan Note (Signed)
 Will increase Pristiq  to 50 mg once daily

## 2024-03-14 NOTE — Patient Instructions (Signed)
 I have sent in Vyvanse 10 mg for you to take once daily.  This medication can cause some side effects like increased heart rate, increased blood pressure.  Please keep an eye on your vital signs and let me know if they are higher than usual.  Follow up with me in one month for evaluation of medication effectiveness.

## 2024-03-14 NOTE — Telephone Encounter (Signed)
 A PA is needed for her Vyvanse. Please advise.

## 2024-03-14 NOTE — Progress Notes (Signed)
 Established Patient Office Visit  Subjective   Patient ID: Apryll Hinkle, female    DOB: 29-Jan-2001  Age: 23 y.o. MRN: [id number]  No chief complaint on file.   HPI Patient presents today for medication management. Reports compliance with medication regimen.  Reports that Pristiq  is not helping her as much as she was hoping it would. Genesight testing performed. Has tried: Lexapro  with palpitations, Zoloft , Prozac , nortriptyline . Has propranolol , not currently taking. Migraines are under control.  Reports that she has had an increase in anxiety and is forgetting things that she did not previously.  Medical history as outlined below. Denies SI, HI.  ROS Per HPI    Objective:     BP 126/88   Pulse 78   Ht 5' 7 (1.702 m)   Wt 211 lb (95.7 kg)   SpO2 96%   BMI 33.05 kg/m   Physical Exam Vitals and nursing note reviewed.  Constitutional:      General: She is not in acute distress.    Appearance: Normal appearance. She is normal weight.  HENT:     Head: Normocephalic and atraumatic.     Right Ear: External ear normal.     Left Ear: External ear normal.     Nose: Nose normal.     Mouth/Throat:     Mouth: Mucous membranes are moist.     Pharynx: Oropharynx is clear.   Eyes:     Extraocular Movements: Extraocular movements intact.     Pupils: Pupils are equal, round, and reactive to light.    Cardiovascular:     Rate and Rhythm: Normal rate and regular rhythm.     Pulses: Normal pulses.     Heart sounds: Normal heart sounds.  Pulmonary:     Effort: Pulmonary effort is normal. No respiratory distress.     Breath sounds: Normal breath sounds. No wheezing, rhonchi or rales.   Musculoskeletal:        General: Normal range of motion.     Cervical back: Normal range of motion.     Right lower leg: No edema.     Left lower leg: No edema.  Lymphadenopathy:     Cervical: No cervical adenopathy.   Neurological:     General: No focal deficit present.      Mental Status: She is alert and oriented to person, place, and time.   Psychiatric:        Mood and Affect: Mood normal.        Thought Content: Thought content normal.      No results found for any visits on 03/14/24.   The ASCVD Risk score (Arnett DK, et al., 2019) failed to calculate for the following reasons:   The 2019 ASCVD risk score is only valid for ages 73 to 47    Assessment & Plan:   MDD (major depressive disorder), recurrent episode, moderate (HCC) Assessment & Plan: Will increase Pristiq  to 50 mg once daily  Orders: -     Desvenlafaxine  Succinate ER; Take 1 tablet (50 mg total) by mouth daily.  Dispense: 30 tablet; Refill: 1  GAD (generalized anxiety disorder) Assessment & Plan: Will increase Pristiq  to 50 mg once daily  Orders: -     Desvenlafaxine  Succinate ER; Take 1 tablet (50 mg total) by mouth daily.  Dispense: 30 tablet; Refill: 1  Panic attack Assessment & Plan: Continue BuSpar  as needed   Attention deficit hyperactivity disorder (ADHD), combined type Assessment & Plan: ADHD self eval questionnaire positive  today Start Vyvanse 10 mg once daily Follow-up in a month  Orders: -     Lisdexamfetamine Dimesylate; Take 1 capsule (10 mg total) by mouth daily.  Dispense: 30 capsule; Refill: 0  Medication management Assessment & Plan: Start Vyvanse, increase Pristiq  to 50 mg once daily      Return in about 4 weeks (around 04/11/2024) for Meds.    Corean LITTIE Ku, FNP

## 2024-03-14 NOTE — Assessment & Plan Note (Signed)
 Start Vyvanse, increase Pristiq  to 50 mg once daily

## 2024-03-14 NOTE — Assessment & Plan Note (Signed)
 Continue BuSpar as needed.

## 2024-03-14 NOTE — Assessment & Plan Note (Signed)
 ADHD self eval questionnaire positive today Start Vyvanse 10 mg once daily Follow-up in a month

## 2024-03-17 ENCOUNTER — Other Ambulatory Visit (HOSPITAL_COMMUNITY): Payer: Self-pay

## 2024-03-17 ENCOUNTER — Telehealth: Payer: Self-pay

## 2024-03-17 NOTE — Telephone Encounter (Signed)
 Notified pt of approval.

## 2024-03-17 NOTE — Telephone Encounter (Signed)
 Pharmacy Patient Advocate Encounter   Received notification from Pt Calls Messages that prior authorization for Lisdexamfetamine 10mg  caps is required/requested.   Insurance verification completed.   The patient is insured through [facility] .   Per test claim: PA required; PA submitted to above mentioned insurance via CoverMyMeds Key/confirmation #/EOC [id number] Status is pending

## 2024-03-17 NOTE — Telephone Encounter (Signed)
 Pharmacy Patient Advocate Encounter  Received notification from [facility] that Prior Authorization for Lisdexamfetamine 10mg  caps has been APPROVED from 03/17/24 to 03/17/27   PA #/Case ID/Reference #: [id number]  Left a message at [facility] to notify of the approval.

## 2024-03-18 ENCOUNTER — Other Ambulatory Visit: Payer: Self-pay

## 2024-03-19 ENCOUNTER — Other Ambulatory Visit (HOSPITAL_COMMUNITY): Payer: Self-pay

## 2024-03-20 ENCOUNTER — Other Ambulatory Visit (HOSPITAL_COMMUNITY): Payer: Self-pay

## 2024-03-26 ENCOUNTER — Other Ambulatory Visit: Payer: Self-pay

## 2024-03-26 DIAGNOSIS — I10 Essential (primary) hypertension: Secondary | ICD-10-CM

## 2024-03-26 DIAGNOSIS — J4520 Mild intermittent asthma, uncomplicated: Secondary | ICD-10-CM

## 2024-03-26 MED ORDER — MONTELUKAST SODIUM 10 MG PO TABS: 10.0000 mg | ORAL_TABLET | Freq: Every day | ORAL | 3 refills | Status: DC

## 2024-03-26 MED ORDER — AMLODIPINE BESYLATE 5 MG PO TABS: 5.0000 mg | ORAL_TABLET | Freq: Every day | ORAL | 3 refills | Status: DC

## 2024-03-27 ENCOUNTER — Encounter: Payer: Self-pay | Admitting: Neurology

## 2024-03-27 ENCOUNTER — Other Ambulatory Visit: Payer: Self-pay | Admitting: Family Medicine

## 2024-03-27 DIAGNOSIS — J4520 Mild intermittent asthma, uncomplicated: Secondary | ICD-10-CM

## 2024-03-27 DIAGNOSIS — I10 Essential (primary) hypertension: Secondary | ICD-10-CM

## 2024-03-27 MED ORDER — AMLODIPINE BESYLATE 5 MG PO TABS: 5.0000 mg | ORAL_TABLET | Freq: Every day | ORAL | 3 refills | Status: DC

## 2024-03-27 MED ORDER — MONTELUKAST SODIUM 10 MG PO TABS: 10.0000 mg | ORAL_TABLET | Freq: Every day | ORAL | 3 refills | Status: DC

## 2024-03-31 ENCOUNTER — Other Ambulatory Visit (HOSPITAL_COMMUNITY): Payer: Self-pay

## 2024-03-31 ENCOUNTER — Other Ambulatory Visit: Payer: Self-pay

## 2024-03-31 ENCOUNTER — Telehealth: Payer: Self-pay | Admitting: Neurology

## 2024-03-31 DIAGNOSIS — J4520 Mild intermittent asthma, uncomplicated: Secondary | ICD-10-CM

## 2024-03-31 DIAGNOSIS — Z0279 Encounter for issue of other medical certificate: Secondary | ICD-10-CM

## 2024-03-31 DIAGNOSIS — I10 Essential (primary) hypertension: Secondary | ICD-10-CM

## 2024-03-31 MED ORDER — MONTELUKAST SODIUM 10 MG PO TABS
10.0000 mg | ORAL_TABLET | Freq: Every day | ORAL | 5 refills | Status: AC
  Filled 2024-03-31: qty 30, 30d supply, fill #0
  Filled 2024-04-26: qty 30, 30d supply, fill #1
  Filled 2024-05-23: qty 30, 30d supply, fill #2
  Filled 2024-06-24: qty 30, 30d supply, fill #3
  Filled 2024-07-29: qty 30, 30d supply, fill #4

## 2024-03-31 MED ORDER — AMLODIPINE BESYLATE 5 MG PO TABS
5.0000 mg | ORAL_TABLET | Freq: Every day | ORAL | 5 refills | Status: AC
  Filled 2024-03-31: qty 30, 30d supply, fill #0
  Filled 2024-04-26: qty 30, 30d supply, fill #1
  Filled 2024-05-23: qty 30, 30d supply, fill #2
  Filled 2024-06-24: qty 30, 30d supply, fill #3
  Filled 2024-07-29: qty 30, 30d supply, fill #4

## 2024-03-31 NOTE — Telephone Encounter (Signed)
 Received FMLA forms to be filled out. The pt paid the $25 fee and would like them to be faxed in once completed.   Forms are in Dr. Jayme box

## 2024-04-07 ENCOUNTER — Other Ambulatory Visit (HOSPITAL_COMMUNITY): Payer: Self-pay

## 2024-04-07 ENCOUNTER — Other Ambulatory Visit: Payer: Self-pay | Admitting: Family Medicine

## 2024-04-07 DIAGNOSIS — F902 Attention-deficit hyperactivity disorder, combined type: Secondary | ICD-10-CM

## 2024-04-07 MED ORDER — LISDEXAMFETAMINE DIMESYLATE 20 MG PO CAPS
20.0000 mg | ORAL_CAPSULE | Freq: Every day | ORAL | 0 refills | Status: DC
  Filled 2024-04-07 – 2024-04-21 (×2): qty 30, 30d supply, fill #0

## 2024-04-07 NOTE — Progress Notes (Unsigned)
 Vyvanse  10mg  not lasting all day.  Increase to 20mg  Vyvanse  once daily.

## 2024-04-10 NOTE — Progress Notes (Addendum)
 NEUROLOGY FOLLOW UP OFFICE NOTE  Katherine Parrish [id number]  Assessment/Plan:   Migraine with aura, without status migrainosus, not intractable Tension-type headache, not intractable  Migraine prevention: not indicated Migraine rescue:  Nurtec PRN Limit use of pain relievers to no more than 9 days out of the month to prevent risk of rebound or medication-overuse headache. Keep headache diary Follow up 9 months.    Subjective:  Katherine Parrish is a 23 year old female with HTN, thrombocytopenia and asthma who follows up for migraines.  UPDATE: She tried perimenstural prophylaxis with Nurtec.  However, it was no longer associated with her cycle so she took as needed. Migraines became sporadic again, so she was prescribed propranolol  and eletriptan .  Propranolol  caused hot flashes, so it was discontinued.   Migraines improved Intensity:  7-8/10 Duration:  1 to 2 hours with Nurtec Frequency: 1 to 2 days a month.  Tension type headaches stable  Current NSAIDS/analgesics:  ibuprofen  Current triptans:  none Current ergotamine:  none Current anti-emetic:  none Current muscle relaxants:  none Current Antihypertensive medications:  amlodipine  Current Antidepressant medications:  desvenlafaxine  Current Anticonvulsant medications:  none Current anti-CGRP:  Nurtec PRN  Current Vitamins/Herbal/Supplements:  none Current Antihistamines/Decongestants:  meclizine , Flonase  Other therapy:  none Birth control:  none Other medications:  buspirone    Caffeine:  tries to avoid caffeine Diet:  32 oz water daily.  No soda.  Does not skip meals. Exercise:  no Depression:  none; Anxiety:  none Other pain:  no Sleep hygiene:  improved.  HISTORY: Migraines: History of migraines for several years.  Used to last a day and occur every few weeks.  They started to increase after she had her son in 2022.  After she gave birth, she had an IUD implanted.  They became more frequent.  She had her IUD  removed in August 2023 which was minimally helpful.  At that time, she had several ED visits.  At ED visit on 01/17/2023, she had a CT head performed which was normal.  They occured 15 days a month on average.   However, migraines subsequently became purely menstrual.  Her menstrual cycle starts on the 18th.  Migraines begin on the 16th and last for total of 5 days.  Nothing seems to break them.  They later became spontaneous again.    Migraines are 7/10 right frontal pressure with left retro-orbital pressure associated with dizziness, nausea, photophobia and osmophobia.  No associated visual disturbance.  Quick head movements and positional changes aggravate dizziness.  Rest helps with headaches.  Takes multiple over the counter NSAIDs and analgesics 3 days a week.   Afterwards  Tension-type headache: She tends to have mild tension type headaches (pressure in the temples without associated symptoms) about 3 to 4 days a month, lasts 3-4 hours.  May treat with ibuprofen .    Past NSAIDS/analgesics:  naproxen  500mg , Tylenol, ibuprofen , naproxen , BC powder Past abortive triptans:  rizatriptan , sumatriptan  100mg  Past abortive ergotamine:  none Past muscle relaxants:  tizanidine  Past anti-emetic:  Zofran  ODT 8mg , Compazine   Past antihistamine:  meclizine     Past antihypertensive medications:  propranolol  (hot flashes), HCTZ Past antidepressant medications:  nortriptyline  (irritability), sertraline  Past anticonvulsant medications:  topiramate  (intolerance) Past anti-CGRP:  Ubrelvy 100mg , Nurtec perimenstrual prophylaxis (one daily for 8 days beginning 1 day prior to first day of migraine on the 15th of the month)  Past vitamins/Herbal/Supplements:  none Past antihistamines/decongestants:  none Other past therapies:  none   Family history of headache:  no  PAST MEDICAL HISTORY: Past Medical History:  Diagnosis Date   Asthma    Chronic hypertension with superimposed pre-eclampsia 03/14/2021    Hypertension    Vaginal delivery 03/15/2021    MEDICATIONS: Current Outpatient Medications on File Prior to Visit  Medication Sig Dispense Refill   albuterol  (VENTOLIN  HFA) 108 (90 Base) MCG/ACT inhaler Inhale 2 puffs into the lungs every 6 (six) hours as needed for wheezing or shortness of breath. 6.7 g 0   Albuterol -Budesonide  (AIRSUPRA ) 90-80 MCG/ACT AERO Inhale 2 puffs into the lungs every 4 (four) hours as needed. 10.7 g 3   amLODipine  (NORVASC ) 5 MG tablet Take 1 tablet (5 mg total) by mouth daily. 30 tablet 5   busPIRone  (BUSPAR ) 5 MG tablet Take 1 tablet (5 mg total) by mouth 3 (three) times daily with meals. 90 tablet 0   clotrimazole  (GYNE-LOTRIMIN  3) 2 % vaginal cream Place 1 Applicatorful vaginally at bedtime. 21 g 2   desvenlafaxine  (PRISTIQ ) 50 MG 24 hr tablet Take 1 tablet (50 mg total) by mouth daily. 30 tablet 1   fluconazole  (DIFLUCAN ) 150 MG tablet Take 1 tablet by mouth after completing antibiotic. Within 72 hours, if symptoms still present, take the second tablet. 2 tablet 5   levocetirizine (XYZAL ) 5 MG tablet Take 5 mg by mouth every evening.     lisdexamfetamine (VYVANSE ) 20 MG capsule Take 1 capsule (20 mg total) by mouth daily. 30 capsule 0   montelukast  (SINGULAIR ) 10 MG tablet Take 1 tablet (10 mg total) by mouth at bedtime. 30 tablet 5   nystatin -triamcinolone  (MYCOLOG II) cream APPLY TO THE AFFECTED AREA(S) BY TOPICAL ROUTE 2 TIMES PER DAY IN THEMORNING AND EVENING 30 g 0   ondansetron  (ZOFRAN -ODT) 4 MG disintegrating tablet Take 1 tablet (4 mg total) by mouth every 8 (eight) hours as needed for nausea or vomiting. 20 tablet 5   propranolol  ER (INDERAL  LA) 60 MG 24 hr capsule Take 1 capsule (60 mg total) by mouth daily. 30 capsule 5   Rimegepant Sulfate  (NURTEC) 75 MG TBDP Take 1 tablet (75 mg total) by mouth daily as needed. 8 tablet 11   Vitamin D , Ergocalciferol , (DRISDOL ) 1.25 MG (50000 UNIT) CAPS capsule Take 1 capsule (50,000 Units total) by mouth every 7  (seven) days. 8 capsule 0   [DISCONTINUED] Fluticasone  Propionate, Inhal, 100 MCG/ACT AEPB Inhale 1 puff into the lungs in the morning and at bedtime. 60 each 1   No current facility-administered medications on file prior to visit.    ALLERGIES: Allergies  Allergen Reactions   Aspirin Nausea Only   Lexapro  [Escitalopram ] Palpitations    FAMILY HISTORY: Family History  Problem Relation Age of Onset   Uterine cancer Mother 4       leiomyosarcoma; d. 22   Hypertension Mother    Thyroid  disease Mother    Anxiety disorder Mother    Diabetes Mellitus II Father    Breast cancer Maternal Grandmother        dx 30s/40s   Cancer Paternal Grandfather        dx 58s; unknown primary; ? lung   Colon cancer Neg Hx       Objective:  Blood pressure 129/85, pulse 91, resp. rate 20, height 5' 7 (1.702 m), weight 214 lb (97.1 kg), SpO2 96%. General: No acute distress.  Patient appears well-groomed.   Head:  Normocephalic/atraumatic Neck:  Supple.  No paraspinal tenderness.  Full range of motion. Heart:  Regular rate and rhythm. Neuro:  Alert and oriented.  Speech fluent and not dysarthric.  Language intact.  CN II-XII intact.  Bulk and tone normal.  Muscle strength 5/5 throughout.  Sensation to light touch intact.  Deep tendon reflexes 2+ throughout, toes downgoing.  Gait normal.  Romberg negative.   Juliene Dunnings, DO  CC: Evalene Arts, FNP

## 2024-04-14 ENCOUNTER — Encounter: Payer: Self-pay | Admitting: Neurology

## 2024-04-14 ENCOUNTER — Ambulatory Visit: Payer: 59 | Admitting: Neurology

## 2024-04-14 VITALS — BP 129/85 | HR 91 | Resp 20 | Ht 67.0 in | Wt 214.0 lb

## 2024-04-14 DIAGNOSIS — G44219 Episodic tension-type headache, not intractable: Secondary | ICD-10-CM

## 2024-04-14 DIAGNOSIS — G43109 Migraine with aura, not intractable, without status migrainosus: Secondary | ICD-10-CM | POA: Diagnosis not present

## 2024-04-14 NOTE — Patient Instructions (Signed)
Nurtec once daily as needed

## 2024-04-16 ENCOUNTER — Other Ambulatory Visit (HOSPITAL_COMMUNITY): Payer: Self-pay

## 2024-04-21 ENCOUNTER — Other Ambulatory Visit (HOSPITAL_COMMUNITY): Payer: Self-pay

## 2024-04-22 ENCOUNTER — Other Ambulatory Visit (HOSPITAL_COMMUNITY): Payer: Self-pay

## 2024-04-22 ENCOUNTER — Other Ambulatory Visit: Payer: Self-pay | Admitting: Family Medicine

## 2024-04-22 DIAGNOSIS — F411 Generalized anxiety disorder: Secondary | ICD-10-CM

## 2024-04-22 DIAGNOSIS — F331 Major depressive disorder, recurrent, moderate: Secondary | ICD-10-CM

## 2024-04-22 MED ORDER — DESVENLAFAXINE SUCCINATE ER 100 MG PO TB24
100.0000 mg | ORAL_TABLET | Freq: Every day | ORAL | 1 refills | Status: DC
  Filled 2024-04-22: qty 30, 30d supply, fill #0
  Filled 2024-05-28: qty 30, 30d supply, fill #1

## 2024-04-24 ENCOUNTER — Other Ambulatory Visit (HOSPITAL_COMMUNITY): Payer: Self-pay

## 2024-04-24 ENCOUNTER — Encounter (HOSPITAL_COMMUNITY): Payer: Self-pay | Admitting: Pharmacist

## 2024-04-25 ENCOUNTER — Other Ambulatory Visit (HOSPITAL_COMMUNITY): Payer: Self-pay

## 2024-04-26 ENCOUNTER — Other Ambulatory Visit (HOSPITAL_COMMUNITY): Payer: Self-pay

## 2024-04-29 ENCOUNTER — Other Ambulatory Visit (HOSPITAL_COMMUNITY)
Admission: RE | Admit: 2024-04-29 | Discharge: 2024-04-29 | Disposition: A | Discharge status: Home / Self Care | Source: Ambulatory Visit | Attending: Family Medicine | Admitting: Family Medicine

## 2024-04-29 ENCOUNTER — Other Ambulatory Visit: Payer: Self-pay | Admitting: Family Medicine

## 2024-04-29 ENCOUNTER — Other Ambulatory Visit

## 2024-04-29 DIAGNOSIS — N760 Acute vaginitis: Secondary | ICD-10-CM | POA: Diagnosis present

## 2024-05-02 ENCOUNTER — Other Ambulatory Visit (HOSPITAL_COMMUNITY): Payer: Self-pay

## 2024-05-02 ENCOUNTER — Ambulatory Visit: Payer: Self-pay | Admitting: Family Medicine

## 2024-05-02 DIAGNOSIS — B9689 Other specified bacterial agents as the cause of diseases classified elsewhere: Secondary | ICD-10-CM

## 2024-05-02 LAB — CERVICOVAGINAL ANCILLARY ONLY
Bacterial Vaginitis (gardnerella): POSITIVE — AB
Chlamydia: NEGATIVE
Comment: NEGATIVE
Comment: NEGATIVE
Comment: NEGATIVE
Comment: NEGATIVE
Comment: NEGATIVE
Comment: NORMAL
Neisseria Gonorrhea: NEGATIVE

## 2024-05-02 MED ORDER — METRONIDAZOLE 500 MG PO TABS
500.0000 mg | ORAL_TABLET | Freq: Two times a day (BID) | ORAL | 0 refills | Status: DC
  Filled 2024-05-02: qty 14, 7d supply, fill #0

## 2024-05-08 ENCOUNTER — Ambulatory Visit: Payer: Self-pay | Admitting: Family Medicine

## 2024-05-08 ENCOUNTER — Other Ambulatory Visit: Payer: Self-pay | Admitting: Family Medicine

## 2024-05-08 ENCOUNTER — Other Ambulatory Visit

## 2024-05-08 DIAGNOSIS — D649 Anemia, unspecified: Secondary | ICD-10-CM | POA: Diagnosis not present

## 2024-05-08 DIAGNOSIS — E559 Vitamin D deficiency, unspecified: Secondary | ICD-10-CM

## 2024-05-08 DIAGNOSIS — T148XXA Other injury of unspecified body region, initial encounter: Secondary | ICD-10-CM

## 2024-05-08 LAB — CBC WITH DIFFERENTIAL/PLATELET
Basophils Absolute: 0 K/uL (ref 0.0–0.1)
Basophils Relative: 0.5 % (ref 0.0–3.0)
Eosinophils Absolute: 0.3 K/uL (ref 0.0–0.7)
Eosinophils Relative: 6.6 % — ABNORMAL HIGH (ref 0.0–5.0)
HCT: 35.8 % — ABNORMAL LOW (ref 36.0–46.0)
Hemoglobin: 11.5 g/dL — ABNORMAL LOW (ref 12.0–15.0)
Lymphocytes Relative: 41.1 % (ref 12.0–46.0)
Lymphs Abs: 1.6 K/uL (ref 0.7–4.0)
MCHC: 32.1 g/dL (ref 30.0–36.0)
MCV: 85 fl (ref 78.0–100.0)
Monocytes Absolute: 0.4 K/uL (ref 0.1–1.0)
Monocytes Relative: 10 % (ref 3.0–12.0)
Neutro Abs: 1.6 K/uL (ref 1.4–7.7)
Neutrophils Relative %: 41.8 % — ABNORMAL LOW (ref 43.0–77.0)
Platelets: 133 K/uL — ABNORMAL LOW (ref 150.0–400.0)
RBC: 4.21 Mil/uL (ref 3.87–5.11)
RDW: 15.2 % (ref 11.5–15.5)
WBC: 3.8 K/uL — ABNORMAL LOW (ref 4.0–10.5)

## 2024-05-08 LAB — COMPREHENSIVE METABOLIC PANEL WITH GFR
ALT: 13 U/L (ref 0–35)
AST: 22 U/L (ref 0–37)
Albumin: 4.2 g/dL (ref 3.5–5.2)
Alkaline Phosphatase: 61 U/L (ref 39–117)
BUN: 14 mg/dL (ref 6–23)
CO2: 29 meq/L (ref 19–32)
Calcium: 9.4 mg/dL (ref 8.4–10.5)
Chloride: 104 meq/L (ref 96–112)
Creatinine, Ser: 0.75 mg/dL (ref 0.40–1.20)
GFR: 111.75 mL/min (ref >60.00)
Glucose, Bld: 94 mg/dL (ref 70–99)
Potassium: 3.8 meq/L (ref 3.5–5.1)
Sodium: 140 meq/L (ref 135–145)
Total Bilirubin: 0.3 mg/dL (ref 0.2–1.2)
Total Protein: 7.7 g/dL (ref 6.0–8.3)

## 2024-05-08 LAB — VITAMIN D 25 HYDROXY (VIT D DEFICIENCY, FRACTURES): VITD: 21.72 ng/mL — ABNORMAL LOW (ref 30.00–100.00)

## 2024-05-08 LAB — VITAMIN B12: Vitamin B-12: 249 pg/mL (ref 211–911)

## 2024-05-23 ENCOUNTER — Other Ambulatory Visit: Payer: Self-pay

## 2024-05-23 ENCOUNTER — Other Ambulatory Visit (HOSPITAL_COMMUNITY): Payer: Self-pay

## 2024-05-28 ENCOUNTER — Other Ambulatory Visit (HOSPITAL_COMMUNITY): Payer: Self-pay

## 2024-05-28 ENCOUNTER — Other Ambulatory Visit: Payer: Self-pay | Admitting: Family Medicine

## 2024-05-28 ENCOUNTER — Other Ambulatory Visit: Payer: Self-pay

## 2024-05-28 DIAGNOSIS — F902 Attention-deficit hyperactivity disorder, combined type: Secondary | ICD-10-CM

## 2024-05-28 DIAGNOSIS — J4520 Mild intermittent asthma, uncomplicated: Secondary | ICD-10-CM

## 2024-05-28 DIAGNOSIS — J3089 Other allergic rhinitis: Secondary | ICD-10-CM

## 2024-05-28 MED ORDER — LISDEXAMFETAMINE DIMESYLATE 30 MG PO CAPS
30.0000 mg | ORAL_CAPSULE | Freq: Every day | ORAL | 0 refills | Status: DC
  Filled 2024-05-28: qty 30, 30d supply, fill #0

## 2024-05-28 MED ORDER — LEVOCETIRIZINE DIHYDROCHLORIDE 5 MG PO TABS
5.0000 mg | ORAL_TABLET | Freq: Every evening | ORAL | 2 refills | Status: DC
  Filled 2024-05-28: qty 30, 30d supply, fill #0
  Filled 2024-06-24: qty 30, 30d supply, fill #1
  Filled 2024-07-29: qty 30, 30d supply, fill #2

## 2024-05-29 ENCOUNTER — Other Ambulatory Visit: Payer: Self-pay | Admitting: Family Medicine

## 2024-05-29 ENCOUNTER — Other Ambulatory Visit (HOSPITAL_COMMUNITY): Payer: Self-pay

## 2024-05-29 DIAGNOSIS — F411 Generalized anxiety disorder: Secondary | ICD-10-CM

## 2024-05-29 DIAGNOSIS — F331 Major depressive disorder, recurrent, moderate: Secondary | ICD-10-CM

## 2024-05-29 MED ORDER — DESVENLAFAXINE SUCCINATE ER 100 MG PO TB24
100.0000 mg | ORAL_TABLET | Freq: Every day | ORAL | 5 refills | Status: DC
  Filled 2024-05-29 – 2024-06-24 (×2): qty 30, 30d supply, fill #0
  Filled 2024-07-29: qty 30, 30d supply, fill #1

## 2024-06-03 ENCOUNTER — Other Ambulatory Visit (HOSPITAL_COMMUNITY): Payer: Self-pay

## 2024-06-03 ENCOUNTER — Encounter: Payer: Self-pay | Admitting: Family Medicine

## 2024-06-03 MED ORDER — AZITHROMYCIN 250 MG PO TABS
ORAL_TABLET | ORAL | 0 refills | Status: AC
Start: 2024-06-03 — End: 2024-06-08
  Filled 2024-06-03: qty 6, 5d supply, fill #0

## 2024-06-23 ENCOUNTER — Other Ambulatory Visit: Payer: Self-pay | Admitting: Family Medicine

## 2024-06-24 ENCOUNTER — Other Ambulatory Visit: Payer: Self-pay | Admitting: Neurology

## 2024-06-24 ENCOUNTER — Other Ambulatory Visit: Payer: Self-pay

## 2024-06-24 ENCOUNTER — Other Ambulatory Visit (HOSPITAL_COMMUNITY): Payer: Self-pay

## 2024-06-24 MED ORDER — ONDANSETRON 4 MG PO TBDP
4.0000 mg | ORAL_TABLET | Freq: Three times a day (TID) | ORAL | 1 refills | Status: DC | PRN
  Filled 2024-06-24: qty 18, 21d supply, fill #0

## 2024-06-26 ENCOUNTER — Other Ambulatory Visit: Payer: Self-pay

## 2024-07-02 ENCOUNTER — Other Ambulatory Visit: Payer: Self-pay

## 2024-07-02 ENCOUNTER — Other Ambulatory Visit: Payer: Self-pay | Admitting: Family Medicine

## 2024-07-02 ENCOUNTER — Encounter (HOSPITAL_BASED_OUTPATIENT_CLINIC_OR_DEPARTMENT_OTHER): Payer: Self-pay

## 2024-07-02 ENCOUNTER — Emergency Department (HOSPITAL_BASED_OUTPATIENT_CLINIC_OR_DEPARTMENT_OTHER)
Admission: EM | Admit: 2024-07-02 | Discharge: 2024-07-02 | Disposition: A | Discharge status: Home / Self Care | Attending: Emergency Medicine | Admitting: Emergency Medicine

## 2024-07-02 ENCOUNTER — Encounter: Payer: Self-pay | Admitting: Neurology

## 2024-07-02 DIAGNOSIS — G43109 Migraine with aura, not intractable, without status migrainosus: Secondary | ICD-10-CM

## 2024-07-02 DIAGNOSIS — G43809 Other migraine, not intractable, without status migrainosus: Secondary | ICD-10-CM | POA: Insufficient documentation

## 2024-07-02 DIAGNOSIS — R110 Nausea: Secondary | ICD-10-CM

## 2024-07-02 DIAGNOSIS — R519 Headache, unspecified: Secondary | ICD-10-CM | POA: Diagnosis present

## 2024-07-02 MED ORDER — LACTATED RINGERS IV BOLUS
1000.0000 mL | Freq: Once | INTRAVENOUS | Status: AC
  Administered 2024-07-02: 1000 mL via INTRAVENOUS

## 2024-07-02 MED ORDER — METOCLOPRAMIDE HCL 5 MG/ML IJ SOLN
10.0000 mg | Freq: Once | INTRAMUSCULAR | Status: AC
  Administered 2024-07-02: 10 mg via INTRAVENOUS
  Filled 2024-07-02: qty 2

## 2024-07-02 MED ORDER — DIPHENHYDRAMINE HCL 50 MG/ML IJ SOLN
25.0000 mg | Freq: Once | INTRAMUSCULAR | Status: AC
  Administered 2024-07-02: 25 mg via INTRAVENOUS
  Filled 2024-07-02: qty 1

## 2024-07-02 MED ORDER — KETOROLAC TROMETHAMINE 30 MG/ML IJ SOLN
30.0000 mg | Freq: Once | INTRAMUSCULAR | Status: AC
  Administered 2024-07-02: 30 mg via INTRAVENOUS
  Filled 2024-07-02: qty 1

## 2024-07-02 MED ORDER — PREDNISONE 10 MG (21) PO TBPK: ORAL_TABLET | Freq: Every day | ORAL | 0 refills | Status: AC

## 2024-07-02 MED ORDER — AMLODIPINE BESYLATE 5 MG PO TABS
10.0000 mg | ORAL_TABLET | Freq: Once | ORAL | Status: AC
  Administered 2024-07-02: 10 mg via ORAL
  Filled 2024-07-02: qty 2

## 2024-07-02 MED ORDER — PROMETHAZINE HCL 12.5 MG PO TABS: 12.5000 mg | ORAL_TABLET | Freq: Three times a day (TID) | ORAL | 0 refills | Status: DC | PRN

## 2024-07-02 MED ORDER — BUTALBITAL-APAP-CAFFEINE 50-325-40 MG PO TABS: 1.0000 | ORAL_TABLET | Freq: Four times a day (QID) | ORAL | 0 refills | Status: DC | PRN

## 2024-07-02 NOTE — Discharge Instructions (Signed)
 1.  Continue your home therapy for migraine management. 2.  Follow-up with your neurologist as needed. 3.  Return to emergency department if you have new or atypical symptoms.

## 2024-07-02 NOTE — ED Triage Notes (Signed)
 Reports left eye twitching since Saturday. Migraine left side across forehead x 3 days. N/V Nurtec x 2 days. No relief from medication yesterday Light sensitivity

## 2024-07-02 NOTE — ED Provider Notes (Signed)
 [place] [facility] Provider Note   CSN: [id number] Arrival date & time: 07/02/24  9088     Patient presents with: Migraine   Katherine Parrish is a 23 y.o. female.   HPI Patient has history of migraine headaches.  She sees neurology.  Patient reports she has had a headache for about 3 days.  She got some relief yesterday but the headache is back.  She reports her left eye has been twitching since Saturday.  The headache is left-sided.  She tried her Nurtec and got relief 1 day but the headache rebounded again.  Patient reports she has had some nausea and vomiting in association with this and light sensitivity.  No new or atypical symptoms.  No visual disruption.  No weakness numbness or tingling.  No fevers no chills no neck stiffness.  Patient reports she did not take her blood pressure medication this morning.    Prior to Admission medications   Medication Sig Start Date End Date Taking? Authorizing Provider  amLODipine  (NORVASC ) 5 MG tablet Take 1 tablet (5 mg total) by mouth daily. 03/31/24  Yes Alvia Corean CROME, FNP  desvenlafaxine  (PRISTIQ ) 100 MG 24 hr tablet Take 1 tablet (100 mg total) by mouth daily. 05/29/24  Yes Alvia Corean CROME, FNP  levocetirizine (XYZAL ) 5 MG tablet Take 1 tablet (5 mg total) by mouth every evening. 05/28/24  Yes Alvia Corean CROME, FNP  lisdexamfetamine (VYVANSE ) 30 MG capsule Take 1 capsule (30 mg total) by mouth daily. 05/28/24  Yes Alvia Corean CROME, FNP  montelukast  (SINGULAIR ) 10 MG tablet Take 1 tablet (10 mg total) by mouth at bedtime. 03/31/24  Yes Alvia Corean CROME, FNP  Rimegepant Sulfate  (NURTEC) 75 MG TBDP Take 1 tablet (75 mg total) by mouth daily as needed. 02/21/24  Yes Jaffe, Adam R, DO  albuterol  (VENTOLIN  HFA) 108 (90 Base) MCG/ACT inhaler Inhale 2 puffs into the lungs every 6 (six) hours as needed for wheezing or shortness of breath. 01/31/23   Kennyth Domino, FNP  Albuterol -Budesonide   (AIRSUPRA ) 90-80 MCG/ACT AERO Inhale 2 puffs into the lungs every 4 (four) hours as needed. 12/14/23   Alvia Corean CROME, FNP  busPIRone  (BUSPAR ) 5 MG tablet Take 1 tablet (5 mg total) by mouth 3 (three) times daily with meals. 12/11/23     clotrimazole  (GYNE-LOTRIMIN  3) 2 % vaginal cream Place 1 Applicatorful vaginally at bedtime. 06/22/23   Towana Small, FNP  fluconazole  (DIFLUCAN ) 150 MG tablet Take 1 tablet by mouth after completing antibiotic. Within 72 hours, if symptoms still present, take the second tablet. Patient not taking: Reported on 04/14/2024 10/15/23   Towana Small, FNP  nystatin -triamcinolone  (MYCOLOG II) cream APPLY TO THE AFFECTED AREA(S) BY TOPICAL ROUTE 2 TIMES PER DAY IN [facility] AND EVENING 09/13/23     ondansetron  (ZOFRAN -ODT) 4 MG disintegrating tablet Take 1 tablet (4 mg total) by mouth every 8 (eight) hours as needed for nausea or vomiting. 06/24/24   Skeet, Adam R, DO  Vitamin D , Ergocalciferol , (DRISDOL ) 1.25 MG (50000 UNIT) CAPS capsule Take 1 capsule (50,000 Units total) by mouth every 7 (seven) days. 12/24/23   Alvia Corean CROME, FNP  Fluticasone  Propionate, Inhal, 100 MCG/ACT AEPB Inhale 1 puff into the lungs in the morning and at bedtime. 10/05/21 10/06/21  Job Lukes, PA    Allergies: Lexapro  [escitalopram ]    Review of Systems  Updated Vital Signs BP (!) 140/103   Pulse 81   Temp 98.2 F (36.8 C)  Wt 99.8 kg   LMP 06/02/2024   SpO2 98%   BMI 34.46 kg/m   Physical Exam Constitutional:      Comments: Alert nontoxic clear mental status no respiratory distress.  HENT:     Head: Normocephalic and atraumatic.     Nose: Nose normal.     Mouth/Throat:     Mouth: Mucous membranes are moist.     Pharynx: Oropharynx is clear.  Eyes:     Extraocular Movements: Extraocular movements intact.     Pupils: Pupils are equal, round, and reactive to light.  Cardiovascular:     Rate and Rhythm: Normal rate and regular rhythm.  Pulmonary:      Effort: Pulmonary effort is normal.     Breath sounds: Normal breath sounds.  Abdominal:     General: There is no distension.     Palpations: Abdomen is soft.     Tenderness: There is no abdominal tenderness. There is no guarding.  Musculoskeletal:        General: No swelling or tenderness. Normal range of motion.     Cervical back: Neck supple.     Right lower leg: No edema.     Left lower leg: No edema.  Skin:    General: Skin is warm and dry.  Neurological:     General: No focal deficit present.     Mental Status: She is oriented to person, place, and time.     Cranial Nerves: No cranial nerve deficit.     Sensory: No sensory deficit.     Motor: No weakness.     Coordination: Coordination normal.     Comments: Intact symmetric finger-nose exam.  Intact symmetric heel-shin exam.  Mental status and cognitive function normal.  Speech clear.  No motor deficits.  Psychiatric:        Mood and Affect: Mood normal.     (all labs ordered are listed, but only abnormal results are displayed) Labs Reviewed - No data to display  EKG: None  Radiology: No results found.   Procedures   Medications Ordered in the ED  metoCLOPramide (REGLAN) injection 10 mg (10 mg Intravenous Given 07/02/24 1016)  diphenhydrAMINE  (BENADRYL ) injection 25 mg (25 mg Intravenous Given 07/02/24 1017)  ketorolac  (TORADOL ) 30 MG/ML injection 30 mg (30 mg Intravenous Given 07/02/24 1018)  lactated ringers bolus 1,000 mL (0 mLs Intravenous Stopped 07/02/24 1130)  amLODipine  (NORVASC ) tablet 10 mg (10 mg Oral Given 07/02/24 1002)                                    Medical Decision Making Risk Prescription drug management.   EMR reviewed.  Patient has history of migraines and is seen by Dr. Skeet.  Typically headaches are left-sided with left ocular symptoms.  Today's presentation is consistent with patient's typical migraine.  She does not feel this is different.  She has tried her migraine therapy but  has not gotten relief.  Will treat with Toradol , Reglan and Benadryl .  Patient reassessed after rehydration with lactated ringer and above medications.  She reports she feels much better.  She is alert and using her phone.  No distress.  Mental status normal.  At this time stable for discharge.     Final diagnoses:  Other migraine without status migrainosus, not intractable    ED Discharge Orders     None  Armenta Canning, MD 07/02/24 1134

## 2024-07-02 NOTE — ED Notes (Signed)
 ED Provider at bedside.

## 2024-07-02 NOTE — Progress Notes (Signed)
 Migraine requiring ED visit yesterday. Had migraine cocktail with toradol , reglan, benadryl  with 2 hrs of relief.  Migraine again today, has taken Nurtec, ibuprofen , tylenol with no relief.  Sent fioricet, prednisone  and phenergan to pharmacy.

## 2024-07-04 ENCOUNTER — Other Ambulatory Visit: Payer: Self-pay | Admitting: Family Medicine

## 2024-07-04 ENCOUNTER — Other Ambulatory Visit: Payer: Self-pay | Admitting: Neurology

## 2024-07-04 ENCOUNTER — Other Ambulatory Visit (HOSPITAL_COMMUNITY): Payer: Self-pay

## 2024-07-04 DIAGNOSIS — F902 Attention-deficit hyperactivity disorder, combined type: Secondary | ICD-10-CM

## 2024-07-04 MED ORDER — QULIPTA 60 MG PO TABS
60.0000 mg | ORAL_TABLET | Freq: Every day | ORAL | 5 refills | Status: DC
  Filled 2024-07-04: qty 30, 30d supply, fill #0
  Filled 2024-07-29 – 2024-08-01 (×2): qty 30, 30d supply, fill #1
  Filled 2024-09-14: qty 30, 30d supply, fill #2

## 2024-07-04 MED ORDER — LISDEXAMFETAMINE DIMESYLATE 30 MG PO CAPS: 30.0000 mg | ORAL_CAPSULE | Freq: Every day | ORAL | 0 refills | Status: DC

## 2024-07-04 MED ORDER — LISDEXAMFETAMINE DIMESYLATE 30 MG PO CAPS
30.0000 mg | ORAL_CAPSULE | Freq: Every day | ORAL | 0 refills | Status: DC
  Filled 2024-07-04 – 2024-07-17 (×2): qty 30, 30d supply, fill #0

## 2024-07-07 ENCOUNTER — Other Ambulatory Visit (HOSPITAL_COMMUNITY): Payer: Self-pay

## 2024-07-07 ENCOUNTER — Telehealth: Payer: Self-pay | Admitting: Pharmacy Technician

## 2024-07-07 NOTE — Telephone Encounter (Signed)
 Pharmacy Patient Advocate Encounter   Received notification from Patient Pharmacy that prior authorization for QULIPTA 60MG  is required/requested.   Insurance verification completed.   The patient is insured through [facility].   Per test claim: PA required; PA submitted to above mentioned insurance via Latent Key/confirmation #/EOC [id number] Status is pending

## 2024-07-08 ENCOUNTER — Other Ambulatory Visit (HOSPITAL_COMMUNITY): Payer: Self-pay

## 2024-07-09 ENCOUNTER — Other Ambulatory Visit (HOSPITAL_COMMUNITY): Payer: Self-pay

## 2024-07-09 NOTE — Telephone Encounter (Signed)
 Pharmacy Patient Advocate Encounter  Received notification from [facility] that Prior Authorization for QULIPTA 60MG  has been APPROVED from 10.21.25 to 1.19.26   PA #/Case ID/Reference #: [id number]

## 2024-07-17 ENCOUNTER — Other Ambulatory Visit (HOSPITAL_COMMUNITY): Payer: Self-pay

## 2024-07-29 ENCOUNTER — Other Ambulatory Visit (HOSPITAL_COMMUNITY): Payer: Self-pay

## 2024-07-29 ENCOUNTER — Other Ambulatory Visit: Payer: Self-pay | Admitting: Family Medicine

## 2024-07-29 ENCOUNTER — Other Ambulatory Visit: Payer: Self-pay

## 2024-07-29 DIAGNOSIS — F331 Major depressive disorder, recurrent, moderate: Secondary | ICD-10-CM

## 2024-07-29 DIAGNOSIS — F411 Generalized anxiety disorder: Secondary | ICD-10-CM

## 2024-07-29 MED ORDER — DESVENLAFAXINE SUCCINATE ER 100 MG PO TB24: 100.0000 mg | ORAL_TABLET | Freq: Every day | ORAL | 1 refills | Status: DC

## 2024-07-30 ENCOUNTER — Other Ambulatory Visit: Payer: Self-pay

## 2024-08-15 ENCOUNTER — Telehealth: Admitting: Family Medicine

## 2024-08-15 DIAGNOSIS — B9689 Other specified bacterial agents as the cause of diseases classified elsewhere: Secondary | ICD-10-CM | POA: Diagnosis not present

## 2024-08-15 DIAGNOSIS — J019 Acute sinusitis, unspecified: Secondary | ICD-10-CM

## 2024-08-15 DIAGNOSIS — J208 Acute bronchitis due to other specified organisms: Secondary | ICD-10-CM

## 2024-08-15 DIAGNOSIS — J4521 Mild intermittent asthma with (acute) exacerbation: Secondary | ICD-10-CM

## 2024-08-15 MED ORDER — PREDNISONE 20 MG PO TABS: 20.0000 mg | ORAL_TABLET | Freq: Two times a day (BID) | ORAL | 0 refills | Status: AC

## 2024-08-15 MED ORDER — AMOXICILLIN-POT CLAVULANATE 875-125 MG PO TABS: 1.0000 | ORAL_TABLET | Freq: Two times a day (BID) | ORAL | 0 refills | Status: AC

## 2024-08-15 MED ORDER — BENZONATATE 200 MG PO CAPS: 200.0000 mg | ORAL_CAPSULE | Freq: Two times a day (BID) | ORAL | 0 refills | Status: DC | PRN

## 2024-08-15 NOTE — Progress Notes (Signed)
 Virtual Visit Consent   Katherine Parrish, you are scheduled for a virtual visit with a [facility] provider today. Just as with appointments in the office, your consent must be obtained to participate. Your consent will be active for this visit and any virtual visit you may have with one of our providers in the next 365 days. If you have a MyChart account, a copy of this consent can be sent to you electronically.  As this is a virtual visit, video technology does not allow for your provider to perform a traditional examination. This may limit your provider's ability to fully assess your condition. If your provider identifies any concerns that need to be evaluated in person or the need to arrange testing (such as labs, EKG, etc.), we will make arrangements to do so. Although advances in technology are sophisticated, we cannot ensure that it will always work on either your end or our end. If the connection with a video visit is poor, the visit may have to be switched to a telephone visit. With either a video or telephone visit, we are not always able to ensure that we have a secure connection.  By engaging in this virtual visit, you consent to the provision of healthcare and authorize for your insurance to be billed (if applicable) for the services provided during this visit. Depending on your insurance coverage, you may receive a charge related to this service.  I need to obtain your verbal consent now. Are you willing to proceed with your visit today? Lacosta Hargan has provided verbal consent on 08/15/2024 for a virtual visit (video or telephone). Loa Lamp, FNP  Date: 08/15/2024 8:02 AM   Virtual Visit via Video Note   I, Loa Lamp, connected with  Saidah Kempton  ([id number], [birth year]) on 08/15/24 at  8:00 AM EST by a video-enabled telemedicine application and verified that I am speaking with the correct person using two identifiers.  Location: Patient: Virtual Visit Location Patient:  Home Provider: Virtual Visit Location Provider: Home Office   I discussed the limitations of evaluation and management by telemedicine and the availability of in person appointments. The patient expressed understanding and agreed to proceed.    History of Present Illness: Katherine Parrish is a 23 y.o. who identifies as a female who was assigned female at birth, and is being seen today for coughing wheezing for almost a week, head congestion, sinus pressure pain. Post nasal drainage, yellow mucus. No fevers, history of asthma. Sx are worsening. She has airsupra .   HPI: HPI  Problems:  Patient Active Problem List   Diagnosis Date Noted   Attention deficit hyperactivity disorder (ADHD), combined type 03/14/2024   GAD (generalized anxiety disorder) 03/14/2024   MDD (major depressive disorder), recurrent episode, moderate (HCC) 03/14/2024   Panic attack 03/14/2024   Absolute anemia 12/14/2023   Family history of Graves' disease 12/14/2023   Medication management 05/03/2023   Anxiety 05/03/2023   Primary hypertension 05/03/2023   Migraine with aura and without status migrainosus, not intractable 05/03/2023   Genetic testing 08/04/2022   Vitamin D  deficiency 12/14/2021   SVD (spontaneous vaginal delivery) 03/15/2021   Thrombocytopenia 03/14/2021   Candidal vulvovaginitis 01/31/2019   Mild intermittent asthma without complication 12/13/2017   Allergic rhinitis 12/13/2017   Chronic tonsillar hypertrophy 12/13/2017    Allergies:  Allergies  Allergen Reactions   Lexapro  [Escitalopram ] Palpitations   Medications:  Current Outpatient Medications:    albuterol  (VENTOLIN  HFA) 108 (90 Base) MCG/ACT inhaler, Inhale 2 puffs into  the lungs every 6 (six) hours as needed for wheezing or shortness of breath., Disp: 6.7 g, Rfl: 0   Albuterol -Budesonide  (AIRSUPRA ) 90-80 MCG/ACT AERO, Inhale 2 puffs into the lungs every 4 (four) hours as needed., Disp: 10.7 g, Rfl: 3   amLODipine  (NORVASC ) 5 MG tablet,  Take 1 tablet (5 mg total) by mouth daily., Disp: 30 tablet, Rfl: 5   Atogepant  (QULIPTA ) 60 MG TABS, Take 1 tablet (60 mg total) by mouth daily., Disp: 30 tablet, Rfl: 5   busPIRone  (BUSPAR ) 5 MG tablet, Take 1 tablet (5 mg total) by mouth 3 (three) times daily with meals., Disp: 90 tablet, Rfl: 0   butalbital -acetaminophen-caffeine  (FIORICET) 50-325-40 MG tablet, Take 1 tablet by mouth every 6 (six) hours as needed for headache., Disp: 14 tablet, Rfl: 0   clotrimazole  (GYNE-LOTRIMIN  3) 2 % vaginal cream, Place 1 Applicatorful vaginally at bedtime., Disp: 21 g, Rfl: 2   desvenlafaxine  (PRISTIQ ) 100 MG 24 hr tablet, Take 1 tablet (100 mg total) by mouth daily., Disp: 90 tablet, Rfl: 1   fluconazole  (DIFLUCAN ) 150 MG tablet, Take 1 tablet by mouth after completing antibiotic. Within 72 hours, if symptoms still present, take the second tablet. (Patient not taking: Reported on 04/14/2024), Disp: 2 tablet, Rfl: 5   levocetirizine (XYZAL ) 5 MG tablet, Take 1 tablet (5 mg total) by mouth every evening., Disp: 30 tablet, Rfl: 2   lisdexamfetamine (VYVANSE ) 30 MG capsule, Take 1 capsule (30 mg total) by mouth daily., Disp: 30 capsule, Rfl: 0   lisdexamfetamine (VYVANSE ) 30 MG capsule, Take 1 capsule (30 mg total) by mouth daily., Disp: 30 capsule, Rfl: 0   [START ON 09/01/2024] lisdexamfetamine (VYVANSE ) 30 MG capsule, Take 1 capsule (30 mg total) by mouth daily., Disp: 30 capsule, Rfl: 0   montelukast  (SINGULAIR ) 10 MG tablet, Take 1 tablet (10 mg total) by mouth at bedtime., Disp: 30 tablet, Rfl: 5   nystatin -triamcinolone  (MYCOLOG II) cream, APPLY TO THE AFFECTED AREA(S) BY TOPICAL ROUTE 2 TIMES PER DAY IN THEMORNING AND EVENING, Disp: 30 g, Rfl: 0   ondansetron  (ZOFRAN -ODT) 4 MG disintegrating tablet, Take 1 tablet (4 mg total) by mouth every 8 (eight) hours as needed for nausea or vomiting., Disp: 20 tablet, Rfl: 1   promethazine  (PHENERGAN ) 12.5 MG tablet, Take 1 tablet (12.5 mg total) by mouth every 8  (eight) hours as needed for nausea or vomiting., Disp: 20 tablet, Rfl: 0   Rimegepant Sulfate  (NURTEC) 75 MG TBDP, Take 1 tablet (75 mg total) by mouth daily as needed., Disp: 8 tablet, Rfl: 11   Vitamin D , Ergocalciferol , (DRISDOL ) 1.25 MG (50000 UNIT) CAPS capsule, Take 1 capsule (50,000 Units total) by mouth every 7 (seven) days., Disp: 8 capsule, Rfl: 0  Observations/Objective: Patient is well-developed, well-nourished in no acute distress.  Resting comfortably  at home.  Head is normocephalic, atraumatic.  No labored breathing.  Speech is clear and coherent with logical content.  Patient is alert and oriented at baseline.    Assessment and Plan: There are no diagnoses linked to this encounter. Increase fluids humidifier at night, tylenol or ibuprofen  as directed, UC if sx worsen.   Follow Up Instructions: I discussed the assessment and treatment plan with the patient. The patient was provided an opportunity to ask questions and all were answered. The patient agreed with the plan and demonstrated an understanding of the instructions.  A copy of instructions were sent to the patient via MyChart unless otherwise noted below.  The patient was advised to call back or seek an in-person evaluation if the symptoms worsen or if the condition fails to improve as anticipated.    Reshonda Koerber, FNP

## 2024-08-15 NOTE — Patient Instructions (Signed)

## 2024-08-25 ENCOUNTER — Other Ambulatory Visit: Payer: Self-pay | Admitting: Family Medicine

## 2024-08-25 DIAGNOSIS — F902 Attention-deficit hyperactivity disorder, combined type: Secondary | ICD-10-CM

## 2024-08-25 DIAGNOSIS — F331 Major depressive disorder, recurrent, moderate: Secondary | ICD-10-CM

## 2024-08-25 DIAGNOSIS — F411 Generalized anxiety disorder: Secondary | ICD-10-CM

## 2024-09-04 ENCOUNTER — Other Ambulatory Visit: Payer: Self-pay | Admitting: Family Medicine

## 2024-09-04 DIAGNOSIS — N912 Amenorrhea, unspecified: Secondary | ICD-10-CM

## 2024-09-08 ENCOUNTER — Other Ambulatory Visit: Payer: Self-pay

## 2024-09-08 ENCOUNTER — Ambulatory Visit: Payer: Self-pay

## 2024-09-08 DIAGNOSIS — N912 Amenorrhea, unspecified: Secondary | ICD-10-CM

## 2024-09-08 DIAGNOSIS — G43109 Migraine with aura, not intractable, without status migrainosus: Secondary | ICD-10-CM

## 2024-09-08 MED ORDER — KETOROLAC TROMETHAMINE 30 MG/ML IJ SOLN
30.0000 mg | Freq: Once | INTRAMUSCULAR | Status: AC
  Administered 2024-09-08: 30 mg via INTRAMUSCULAR

## 2024-09-08 MED ORDER — METHYLPREDNISOLONE ACETATE 40 MG/ML IJ SUSP
40.0000 mg | Freq: Once | INTRAMUSCULAR | Status: AC
  Administered 2024-09-08: 40 mg via INTRAMUSCULAR

## 2024-09-08 NOTE — Progress Notes (Signed)
 Toradol  and Depo-Medrol  injections given w/o complications.

## 2024-09-09 ENCOUNTER — Ambulatory Visit: Admitting: Emergency Medicine

## 2024-09-09 ENCOUNTER — Telehealth: Payer: Self-pay | Admitting: Pharmacy Technician

## 2024-09-09 ENCOUNTER — Ambulatory Visit: Payer: Self-pay | Admitting: Family Medicine

## 2024-09-09 ENCOUNTER — Other Ambulatory Visit (HOSPITAL_COMMUNITY): Payer: Self-pay

## 2024-09-09 ENCOUNTER — Telehealth: Payer: Self-pay | Admitting: Adult Health

## 2024-09-09 ENCOUNTER — Encounter: Payer: Self-pay | Admitting: Emergency Medicine

## 2024-09-09 VITALS — BP 145/101 | HR 96 | Ht 66.0 in | Wt 209.0 lb

## 2024-09-09 DIAGNOSIS — F331 Major depressive disorder, recurrent, moderate: Secondary | ICD-10-CM

## 2024-09-09 DIAGNOSIS — F411 Generalized anxiety disorder: Secondary | ICD-10-CM | POA: Diagnosis not present

## 2024-09-09 DIAGNOSIS — F988 Other specified behavioral and emotional disorders with onset usually occurring in childhood and adolescence: Secondary | ICD-10-CM | POA: Diagnosis not present

## 2024-09-09 LAB — PROLACTIN: Prolactin: 94.3 ng/mL — ABNORMAL HIGH

## 2024-09-09 MED ORDER — METHYLPHENIDATE HCL ER (OSM) 18 MG PO TBCR
18.0000 mg | EXTENDED_RELEASE_TABLET | Freq: Every day | ORAL | 0 refills | Status: AC
  Filled 2024-09-09: qty 30, 30d supply, fill #0

## 2024-09-09 NOTE — Progress Notes (Signed)
 [facility] MD/PA/NP Initial Note  09/09/2024 4:57 PM Katherine Parrish  MRN:  [id number]  Chief Complaint:  Chief Complaint   Establish Care; Anxiety; Depression; ADD     HPI:   [name] is a 23 yo presenting to clinic for new patient psychiatric evaluation for medication management of anxiety, depression, and concerns about ADD symptoms, referred by her PCP, Katherine Ku, FNP.    Current Psych Medication Regimen: No SE reported. Pristiq  100 mg po daily  She started on Pristiq  by her PCP approximately 3 to 4 months ago and reports feeling stable on this regimen.  Recent workup by her OB/GYN for irregular.  Revealed elevated prolactin levels reported as persistently high on serial labs (119 - 12/10,130 - 12/17, 94 - 12/23), and she was instructed to hold Pristiq  for 4 days (12/18 - 12/122) due to concerns that it might be contributing to hyperprolactinemia. Patient experienced SE of migraines, and she was instructed to resume by PCP.  Her PCP has indicated a brain MRI may be ordered if there is a prolonged wait to see endocrinology. She will be contacting her PCP by end of this week for MRI and/or endocrinology referral.  She was started on Vyvanse  (titrated to 30 mg) by her PCP 1-2 months ago for poor concentration, difficulty focusing, and trouble completing tasks at home which include folding clothes, washing dishes, cooking, and doing things for her 24 yo son.  Discontinued it a few days ago because of increased irritability and would like to trial an alternative medication for focus. Despite this, she reports that depressive symptoms such as sadness, tearfulness, or hopelessness are controlled. Energy and motivation are stable; denies anhedonia. Denies psychomotor agitation or retardation.   Anxiety symptoms are better controlled, but intermittent worry with increased  worry regarding being a good mom and restlessness; no panic attacks reported. Sleep is ok.  Appetite is  stable, with normal weight and intact ADLs and personal hygiene. Ongoing symptom monitoring continues.   She reports no hobbies or self care. 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 She feels guilty doing things for herself.  Engaged in therapy monthly with [facility], which she finds helpful.  Denies mania, delirium, AVH, SI, HI, or self-harm behaviors. No further complaints at this time.   Visit Diagnosis:    ICD-10-CM   1. MDD (major depressive disorder), recurrent episode, moderate (HCC)  F33.1     2. GAD (generalized anxiety disorder)  F41.1     3. Attention deficit disorder, unspecified type  F98.8       Past Psychiatric History:  No prior hospitalizations  Past Psych Medication Trials: Buspar  Lexapro  Vyvanse  Zoloft  Wellbutrin  Prozac   Past Medical History:  Past Medical History:  Diagnosis Date   Asthma    Chronic hypertension with superimposed pre-eclampsia 03/14/2021   Hypertension    Vaginal delivery 03/15/2021    Past Surgical History:  Procedure Laterality Date   HERNIA REPAIR      Family Psychiatric History:   M - anxiety; passed away 09/25/22    No FH of suicide, bipolar or schizophrenia    Family History:  Family History  Problem Relation Age of Onset   Uterine cancer Mother 87       leiomyosarcoma; d. 25   Hypertension Mother    Thyroid  disease Mother    Anxiety disorder Mother    Diabetes Mellitus II Father    Breast cancer Maternal Grandmother  dx 30s/40s   Cancer Paternal Grandfather        dx 58s; unknown primary; ? lung   Colon cancer Neg Hx    Social History:  Patient lives with 50 yo son, Katherine Parrish. Dad is lives locally and mother passed away. 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 Reports supportive social support system. Denies tobacco or substance use. No history of legal issues or domestic violence reported. Financial and housing stability are stable.  Social History    Socioeconomic History   Marital status: Single    Spouse name: Not on file   Number of children: Not on file   Years of education: Not on file   Highest education level: Not on file  Occupational History   Not on file  Tobacco Use   Smoking status: Never   Smokeless tobacco: Never  Vaping Use   Vaping status: Never Used  Substance and Sexual Activity   Alcohol use: No   Drug use: No   Sexual activity: Yes    Birth control/protection: None  Other Topics Concern   Not on file  Social History Narrative   CMA   Lives by myself   27 month old son, [name]   Right handed   Social Drivers of Health   Tobacco Use: Low Risk (08/15/2024)   Patient History    Smoking Tobacco Use: Never    Smokeless Tobacco Use: Never    Passive Exposure: Not on file  Financial Resource Strain: Low Risk (09/09/2024)   Overall Financial Resource Strain (CARDIA)    Difficulty of Paying Living Expenses: Not hard at all  Food Insecurity: Not on file  Transportation Needs: No Transportation Needs (09/09/2024)   Epic    Lack of Transportation (Medical): No    Lack of Transportation (Non-Medical): No  Physical Activity: Not on file  Stress: Not on file  Social Connections: Unknown (03/14/2024)   Social Connection and Isolation Panel    Frequency of Communication with Friends and Family: Not on file    Frequency of Social Gatherings with Friends and Family: Not on file    Attends Religious Services: Not on file    Active Member of Clubs or Organizations: No    Attends Banker Meetings: Not on file    Marital Status: Not on file  Depression (PHQ2-9): Medium Risk (05/02/2023)   Depression (PHQ2-9)    PHQ-2 Score: 7  Alcohol Screen: Low Risk (09/09/2024)   Alcohol Screen    Last Alcohol Screening Score (AUDIT): 3  Housing: Not on file  Utilities: Not At Risk (09/09/2024)   Epic    Threatened with loss of utilities: No  Health Literacy: Adequate Health Literacy (09/09/2024)   B1300  Health Literacy    Frequency of need for help with medical instructions: Never   Allergies: Allergies[1]  Metabolic Disorder Labs: Lab Results  Component Value Date   HGBA1C 5.1 05/03/2023   Lab Results  Component Value Date   PROLACTIN 94.3 (H) 09/08/2024   Lab Results  Component Value Date   CHOL 196 12/14/2023   TRIG 84.0 12/14/2023   HDL 45.40 12/14/2023   CHOLHDL 4 12/14/2023   VLDL 16.8 12/14/2023   LDLCALC 134 (H) 12/14/2023   LDLCALC 151 (H) 05/03/2023   Lab Results  Component Value Date   TSH 1.26 12/14/2023   TSH 2.010 05/03/2023   Therapeutic Level Labs: No results found for: LITHIUM No results found for: VALPROATE No results found for: CBMZ  Current Medications:  Current Outpatient Medications  Medication Sig Dispense Refill   Albuterol -Budesonide  (AIRSUPRA ) 90-80 MCG/ACT AERO Inhale 2 puffs into the lungs every 4 (four) hours as needed. 10.7 g 3   amLODipine  (NORVASC ) 5 MG tablet Take 1 tablet (5 mg total) by mouth daily. 30 tablet 5   Atogepant  (QULIPTA ) 60 MG TABS Take 1 tablet (60 mg total) by mouth daily. 30 tablet 5   desvenlafaxine  (PRISTIQ ) 100 MG 24 hr tablet Take 1 tablet (100 mg total) by mouth daily. 90 tablet 1   methylphenidate  18 MG PO CR tablet Take 1 tablet (18 mg total) by mouth daily. 30 tablet 0   montelukast  (SINGULAIR ) 10 MG tablet Take 1 tablet (10 mg total) by mouth at bedtime. 30 tablet 5   albuterol  (VENTOLIN  HFA) 108 (90 Base) MCG/ACT inhaler Inhale 2 puffs into the lungs every 6 (six) hours as needed for wheezing or shortness of breath. (Patient not taking: Reported on 09/09/2024) 6.7 g 0   benzonatate  (TESSALON ) 200 MG capsule Take 1 capsule (200 mg total) by mouth 2 (two) times daily as needed for cough. (Patient not taking: Reported on 09/09/2024) 20 capsule 0   busPIRone  (BUSPAR ) 5 MG tablet Take 1 tablet (5 mg total) by mouth 3 (three) times daily with meals. 90 tablet 0   butalbital -acetaminophen-caffeine  (FIORICET)  50-325-40 MG tablet Take 1 tablet by mouth every 6 (six) hours as needed for headache. 14 tablet 0   clotrimazole  (GYNE-LOTRIMIN  3) 2 % vaginal cream Place 1 Applicatorful vaginally at bedtime. (Patient not taking: Reported on 09/09/2024) 21 g 2   fluconazole  (DIFLUCAN ) 150 MG tablet Take 1 tablet by mouth after completing antibiotic. Within 72 hours, if symptoms still present, take the second tablet. (Patient not taking: Reported on 09/09/2024) 2 tablet 5   levocetirizine (XYZAL ) 5 MG tablet Take 1 tablet (5 mg total) by mouth every evening. (Patient not taking: Reported on 09/09/2024) 30 tablet 2   lisdexamfetamine  (VYVANSE ) 30 MG capsule Take 1 capsule (30 mg total) by mouth daily. (Patient not taking: Reported on 09/09/2024) 30 capsule 0   lisdexamfetamine  (VYVANSE ) 30 MG capsule Take 1 capsule (30 mg total) by mouth daily. (Patient not taking: Reported on 09/09/2024) 30 capsule 0   lisdexamfetamine  (VYVANSE ) 30 MG capsule Take 1 capsule (30 mg total) by mouth daily. (Patient not taking: Reported on 09/09/2024) 30 capsule 0   nystatin -triamcinolone  (MYCOLOG II) cream APPLY TO THE AFFECTED AREA(S) BY TOPICAL ROUTE 2 TIMES PER DAY IN [facility] AND EVENING (Patient not taking: Reported on 09/09/2024) 30 g 0   ondansetron  (ZOFRAN -ODT) 4 MG disintegrating tablet Take 1 tablet (4 mg total) by mouth every 8 (eight) hours as needed for nausea or vomiting. (Patient not taking: Reported on 09/09/2024) 20 tablet 1   promethazine  (PHENERGAN ) 12.5 MG tablet Take 1 tablet (12.5 mg total) by mouth every 8 (eight) hours as needed for nausea or vomiting. (Patient not taking: Reported on 09/09/2024) 20 tablet 0   Rimegepant Sulfate  (NURTEC) 75 MG TBDP Take 1 tablet (75 mg total) by mouth daily as needed. 8 tablet 11   Vitamin D , Ergocalciferol , (DRISDOL ) 1.25 MG (50000 UNIT) CAPS capsule Take 1 capsule (50,000 Units total) by mouth every 7 (seven) days. 8 capsule 0   No current facility-administered medications for  this visit.    Medication Side Effects: none  Orders placed this visit:  No orders of the defined types were placed in this encounter.   Psychiatric Specialty Exam:  Review of Systems  Psychiatric/Behavioral:  Please refer to HPI.  All other systems reviewed and are negative.   Blood pressure (!) 145/101, pulse 96, height 5' 6 (1.676 m), weight 209 lb (94.8 kg).Body mass index is 33.73 kg/m.  General Appearance: Neat and Well Groomed  Eye Contact:  Good  Speech:  Normal Rate  Volume:  Normal  Mood:  Euthymic  Affect:  Appropriate  Thought Process:  Coherent, Goal Directed, and Linear  Orientation:  Full (Time, Place, and Person)  Thought Content: WDL   Suicidal Thoughts:  No  Homicidal Thoughts:  No  Memory:  WNL  Judgement:  Good  Insight:  Good  Psychomotor Activity:  Normal  Concentration:  Concentration: Good  Recall:  Good  Fund of Knowledge: Good  Language: Good  Assets:  Communication Skills Desire for Improvement Financial Resources/Insurance Talents/Skills Transportation Vocational/Educational  ADL's:  Intact  Cognition: WNL  Prognosis:  Good   Screenings:  GAD-7    Flowsheet Row Office Visit from 09/09/2024 in [facility] Office Visit from 05/02/2023 in [facility] at [facility] Office Visit from 12/14/2021 in [facility] [employer] Visit from 08/23/2021 in [facility] at [place] [employer] from 06/03/2021 in [facility] [place]  Total GAD-7 Score 9 18 3 12 14    PHQ2-9    Flowsheet Row Office Visit from 05/02/2023 in [facility] at [facility] Office Visit from 03/31/2022 in [facility] [place] [employer] from 12/14/2021 in [facility] [place] [employer] from 08/23/2021 in [facility] [employer] at [place] [employer] from 06/03/2021 in [facility] [place]  PHQ-2 Total Score 2 0 5 5 2   PHQ-9 Total Score 7 -- 17 18 7    Flowsheet Row ED from 07/02/2024 in [facility] Emergency Department at [facility] from 10/28/2023 in [facility] at [facility]) ED from 01/17/2023 in [facility] Department at [facility]  C-SSRS RISK CATEGORY No Risk No Risk No Risk    Receiving Psychotherapy:   Engaged in therapy monthly with [facility] with [facility]. Engaged in individual psychotherapy sessions monthly with Counselor [facility] [employer] with [facility], occurring Monthly as part of the overall treatment plan.  Treatment Plan/Recommendations:   I provided approximately 60 minutes of face to face time during this encounter, including time spent before and after the visit in records review, medical decision making, counseling pertinent to today's visit, and charting.   Discussed dx and tx plan. Discussed alternative options including therapy.  PDMP reviewed: low risk trend   Depression/Anxiety - controlled Continue Pristiq  100 mg po daily Refilled by PCP Advised to keep a medication side effect journal to systematically track any symptoms, noting onset, frequency, severity, and context of side effects. Continuation of psychotherapy with  at [facility] was strongly recommended as important adjunct treatment to medication   ADD - not controlled Initiation of methylphenidate  18 mg po daily  Will consider increasing to 27 mg po daily if tolerated and symptoms persist   Monitor SE  including BP, HR, sleep, appetite, anxiety, GI upset  Discussed organizational strategies planners, time management, and mindfulness techniques to support ADHD symptoms  Spent approximately 20 minutes providing education and counseling on therapeutic lifestyle modifications.  Emphasizing the importance  of regular exercise of at least 3x a week for 30 min, incorporating healthier diet options with a focus on balanced nutrition and reduced processed foods, and considering appropriate supplementation including multi vitamin, vitamin d , b 12, magnesium glycinate, reducing cortisol levels, iron to support overall well-being.   FOLLOW UP: 4 weeks or sooner if clinically indicated.  Pt will be following up with PCP/endocrinology regarding further work up for prolactin levels.  Risks, benefits, and alternatives of the medications and treatment plan prescribed today were discussed. Instructed patient to contact office or go to ED if experiencing any significant tolerability issues. Patient engaged in shared decision-making;treatment plan reviewed and agreed upon.    Pinky Ravan, PA-C                [1]  Allergies Allergen Reactions   Lexapro  [Escitalopram ] Palpitations

## 2024-09-09 NOTE — Telephone Encounter (Signed)
 Pharmacy Patient Advocate Encounter   Received notification from CoverMyMeds that prior authorization for QULIPTA  60MG  is required/requested.   Insurance verification completed.   The patient is insured through [employer].   Per test claim: PA required; PA submitted to above mentioned insurance via Latent Key/confirmation #/EOC [id number] Status is pending

## 2024-09-10 ENCOUNTER — Telehealth (HOSPITAL_COMMUNITY): Payer: Self-pay

## 2024-09-10 ENCOUNTER — Other Ambulatory Visit (HOSPITAL_COMMUNITY): Payer: Self-pay

## 2024-09-10 NOTE — Telephone Encounter (Signed)
 Pharmacy Patient Advocate Encounter  Received notification from [employer] that Prior Authorization for  Methylphenidate  HCl ER TBCR 18MG  tablets  has been APPROVED from 09/10/24 to 09/10/27. Ran test claim, Copay is $12.48. This test claim was processed through [facility]- copay amounts may vary at other pharmacies due to pharmacy/plan contracts, or as the patient moves through the different stages of their insurance plan.   PA #/Case ID/Reference #: [id number]

## 2024-09-10 NOTE — Telephone Encounter (Signed)
 Pharmacy Patient Advocate Encounter   Received notification from Patient Pharmacy that prior authorization for Methylphenidate  HCl ER TBCR 18MG  tablets  is required/requested.   Insurance verification completed.   The patient is insured through [employer] [facility]   Per test claim: PA required; PA submitted to above mentioned insurance via Latent Key/confirmation #/EOC [id number] Status is pending

## 2024-09-12 ENCOUNTER — Other Ambulatory Visit: Payer: Self-pay | Admitting: Family

## 2024-09-12 ENCOUNTER — Other Ambulatory Visit: Payer: Self-pay | Admitting: Family Medicine

## 2024-09-12 ENCOUNTER — Other Ambulatory Visit (HOSPITAL_COMMUNITY): Payer: Self-pay

## 2024-09-12 DIAGNOSIS — R7989 Other specified abnormal findings of blood chemistry: Secondary | ICD-10-CM

## 2024-09-14 ENCOUNTER — Encounter: Payer: Self-pay | Admitting: Neurology

## 2024-09-14 ENCOUNTER — Inpatient Hospital Stay: Admission: RE | Admit: 2024-09-14 | Discharge: 2024-09-14 | Payer: Self-pay | Attending: Family Medicine

## 2024-09-14 DIAGNOSIS — R7989 Other specified abnormal findings of blood chemistry: Secondary | ICD-10-CM

## 2024-09-14 MED ORDER — GADOPICLENOL 0.5 MMOL/ML IV SOLN
9.0000 mL | Freq: Once | INTRAVENOUS | Status: AC | PRN
  Administered 2024-09-14: 9 mL via INTRAVENOUS

## 2024-09-15 ENCOUNTER — Other Ambulatory Visit: Payer: Self-pay | Admitting: Family Medicine

## 2024-09-15 ENCOUNTER — Other Ambulatory Visit (HOSPITAL_COMMUNITY): Payer: Self-pay

## 2024-09-15 ENCOUNTER — Other Ambulatory Visit: Payer: Self-pay | Admitting: Neurology

## 2024-09-15 DIAGNOSIS — D352 Benign neoplasm of pituitary gland: Secondary | ICD-10-CM | POA: Insufficient documentation

## 2024-09-15 DIAGNOSIS — N912 Amenorrhea, unspecified: Secondary | ICD-10-CM

## 2024-09-15 DIAGNOSIS — E221 Hyperprolactinemia: Secondary | ICD-10-CM

## 2024-09-15 MED ORDER — QULIPTA 60 MG PO TABS
60.0000 mg | ORAL_TABLET | Freq: Every day | ORAL | 5 refills | Status: AC
  Filled 2024-09-15: qty 30, 30d supply, fill #0

## 2024-09-16 ENCOUNTER — Other Ambulatory Visit (HOSPITAL_COMMUNITY): Payer: Self-pay

## 2024-09-16 ENCOUNTER — Other Ambulatory Visit: Payer: Self-pay | Admitting: Family Medicine

## 2024-09-16 ENCOUNTER — Ambulatory Visit: Payer: Self-pay | Admitting: Family Medicine

## 2024-09-16 ENCOUNTER — Other Ambulatory Visit: Payer: Self-pay

## 2024-09-16 DIAGNOSIS — J4520 Mild intermittent asthma, uncomplicated: Secondary | ICD-10-CM

## 2024-09-16 DIAGNOSIS — J3089 Other allergic rhinitis: Secondary | ICD-10-CM

## 2024-09-16 MED ORDER — LEVOCETIRIZINE DIHYDROCHLORIDE 5 MG PO TABS
5.0000 mg | ORAL_TABLET | Freq: Every evening | ORAL | 1 refills | Status: AC
  Filled 2024-09-16: qty 90, 90d supply, fill #0

## 2024-09-16 NOTE — Progress Notes (Signed)
 Discussed over the phone on 09/14/24 and in person on 09/15/24. Urgent referrals were sent to neurosurgery and endocrinology

## 2024-09-16 NOTE — Telephone Encounter (Signed)
 Plan will not allow a new PA due to current PA still active. However pt will have to do 90 day supply but through mail order. It seems that they do not cover refill through retail.

## 2024-09-17 ENCOUNTER — Ambulatory Visit: Admitting: Neurosurgery

## 2024-09-17 ENCOUNTER — Encounter: Payer: Self-pay | Admitting: Neurosurgery

## 2024-09-17 VITALS — BP 150/108 | HR 90 | Temp 98.3°F | Ht 67.0 in | Wt 205.0 lb

## 2024-09-17 DIAGNOSIS — D352 Benign neoplasm of pituitary gland: Secondary | ICD-10-CM

## 2024-09-17 DIAGNOSIS — E236 Other disorders of pituitary gland: Secondary | ICD-10-CM

## 2024-09-17 NOTE — Progress Notes (Signed)
 " Assessment : Discussed the use of AI scribe software for clinical note transcription with the patient, who gave verbal consent to proceed.  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  She was found to have elevated prolactin levels (136, 116, and 94 ng/mL in December 2025), with the most recent measurement obtained after withholding Pristiq  for four days, resulting in a decrease but remaining above normal. Other hormone levels were reportedly within normal limits. She is scheduled for endocrinology evaluation.  MRI showed a mass or tumor on the pituitary gland, according to the patient's recollection.  Since childbirth in 2022, she has experienced persistent galactorrhea, with occasional milk leakage from the breasts upon expression. She is not currently nursing and did not have nipple discharge prior to her first pregnancy.  Approximately three months ago, she developed menstrual irregularities, with cycle intervals extending to 40-42 days. Around the same time, she began Pristiq  for anxiety and depression.  She has experienced recurrent migraines since 2022, occurring once or twice per month and lasting from one day up to a week. Headaches are typically localized to the forehead with associated tightness around the eyes, are unpredictable, and may be exacerbated by stress. No visual disturbances are reported. Neurology evaluation and CT scan were unremarkable.    Plan : This is indeed a peculiar story: Patient has had headaches since childbirth in 2022 and in the past 3 months has had an irregular cycle.  She says that the irregular cycle, started after she initiated her Pristiq  for her depression.  Her headaches are not linked to her menstrual cycle and they can last for a week at random times.  She states that she has it once or twice a month.  Unfortunately medication has not  helped with this and she was evaluated by Dr. Skeet.  She has had 3 prolactin levels drawn this month.  The first 2 were done at her OB/GYN's office and are not available in epic. The first 1 done in the beginning of December was 134. The second 1 done a week later was 116.   The last 1 that was done here was 96.  For this 1, she was off of Pristiq  for 4 days.  I went over the imaging with her.  This shows that she has a right sided sella mass/cyst which on T1 is hyperintense indicative of high protein content.  There is minimal extension above the sella and the stalk is deviated to the left.  I went over the differential diagnosis with her.  I told her that it is definitely possible that this is medication induced hyperprolactinemia however that is not very likely.  I do not know whether her prolactin levels are coming down physiologically or this was from the Pristiq  though the second level was lower even while she was on Pristiq .  The second option could be that this is a pituitary adenoma producing prolactin.  These are usually solid and not with the signal characteristics that this has though it could be a partially cystic and partially solid tumor.  Typically prolactin producing tumors raise the prolactin level significantly higher than she has.  The last option that this could be is that she had an involution of her pituitary gland at that point had a hemorrhage and this hemorrhage has caused this cyst to form and from that she has persistent elevation of her prolactin and galactorrhea.  I told her  that regardless of what this is, I would not recommend surgery.  I think that we can get an MRI of the brain/pituitary in 6 months without contrast and follow-up.  In the meantime I will get some lab work done.  She was already referred to endocrinology and by the time she sees them, the lab work should be resulted. If in 6 months this has enlarged, then a surgical resection would be an option.  I  told her that if she starts having more headaches or visual problems to return earlier.  She pledged to do so.  I look forward to seeing her back in 6 months.   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, Katherine Parrish   Right handed   Social Drivers of Health   Tobacco Use: Low Risk (09/17/2024)   Patient History    Smoking Tobacco Use: Never    Smokeless Tobacco Use: Never    Passive Exposure: Not on file  Financial Resource Strain: Low Risk (09/09/2024)   Overall Financial Resource Strain (CARDIA)    Difficulty of Paying Living Expenses: Not hard at all  Food Insecurity: Not on file  Transportation Needs: No Transportation Needs (09/09/2024)   Epic    Lack of Transportation (Medical): No    Lack of Transportation (Non-Medical): No  Physical Activity: Not on file  Stress: Not on file  Social Connections: Unknown (03/14/2024)   Social Connection and Isolation Panel    Frequency of Communication with Friends and Family: Not on file    Frequency of Social Gatherings with Friends and Family: Not on file    Attends Religious Services: Not on file    Active Member of Clubs or Organizations: No    Attends Banker Meetings: Not on file    Marital Status: Not on file  Intimate Partner Violence: Not on file  Depression (PHQ2-9): Medium Risk (05/02/2023)   Depression (PHQ2-9)    PHQ-2 Score: 7  Alcohol Screen: Low Risk (09/09/2024)   Alcohol  Screen    Last Alcohol Screening Score (AUDIT): 3  Housing: Not on file  Utilities: Not At Risk (09/09/2024)   Epic    Threatened with loss of utilities: No  Health Literacy: Adequate Health Literacy (09/09/2024)   B1300 Health Literacy    Frequency of need for help with medical instructions: Never    Family History  Problem Relation Age of Onset   Uterine cancer Mother 48       leiomyosarcoma; d. 70   Hypertension Mother    Thyroid  disease Mother    Anxiety disorder Mother    Diabetes Mellitus II Father    Breast cancer Maternal Grandmother        dx 30s/40s   Cancer Paternal Grandfather        dx 82s; unknown primary; ?  lung   Colon cancer Neg Hx     Allergies[1]  Past Medical History:  Diagnosis Date   Asthma    Chronic hypertension with superimposed pre-eclampsia 03/14/2021   Hypertension    Vaginal delivery 03/15/2021    Past Surgical History:  Procedure Laterality Date   HERNIA REPAIR       Physical Exam   Physical Exam HENT:     Head: Normocephalic.     Nose: Nose normal.  Eyes:     Pupils: Pupils are equal, round, and reactive to light.  Cardiovascular:     Rate and Rhythm: Normal rate.  Pulmonary:     Effort: Pulmonary effort is normal.  Abdominal:     General: Abdomen is flat.  Musculoskeletal:     Cervical back: Normal range of motion.  Neurological:     Mental Status: Patient is alert.     Cranial Nerves: Cranial nerves 2-12 are intact.     Sensory: Sensation is intact.     Motor: Motor function is intact.     Coordination: Coordination is intact.     Results for orders placed or performed during the hospital encounter of 09/14/24  MR BRAIN W WO CONTRAST   Narrative   EXAM: MRI BRAIN WITH AND WITHOUT CONTRAST 09/14/2024 12:17:39 PM  TECHNIQUE: Multiplanar multisequence MRI of the head/brain was performed with and without the administration of 9 ml Vueway  intravenous contrast.  COMPARISON: Head CT 05/01/last year.  CLINICAL  HISTORY: 23 year old female with 3 months of elevated prolactin levels.  FINDINGS:  BRAIN AND VENTRICLES: No acute infarct. No acute intracranial hemorrhage. No mass effect or midline shift. No hydrocephalus. Normal flow voids. The major dural venous sinuses are enhancing and appear patent. Normal brain volume. Normal background gray and white matter signal. No encephalomalacia or chronic cerebral blood products identified. No abnormal gray or white matter enhancement. No dural thickening. Negative visible cervical spine.  ORBITS: No abnormality.  SINUSES: Scattered mild paranasal sinus mucosal thickening. Incidental small nasopharynx retention cysts (normal variant). Mastoids are clear. Normal visible internal auditory structures.  BONES AND SOFT TISSUES: Normal bone marrow signal and enhancement. Bone marrow signal at the skull base remains normal. No acute soft tissue abnormality.  PITUITARY: Round and hypoenhancing mass occupying the right aspect of the sella turcica is heterogeneously T1 and T2 hyperintense prior to contrast, and measures 10 x 9 x 10 mm (AP x transverse x CC). Questionable T2 hypointense blood products within the lesion (series 9 image 12). This displaces the more homogeneously enhancing pituitary gland and infundibulum laterally to the left (series 26 image 6). Suprasellar cistern is partially effaced but no mass effect on the optic chiasm (series 16 image 6). Early extension into the right cavernous sinus is difficult to exclude on series 26 image 8, but no overt invasion. Left cavernous sinus is normal. Pneumatized left anterior clinoid process incidentally noted (normal variant). Hypothalamus remains normal. Underlying clivus and posterior walls of the sphenoid sinuses appear intact.  IMPRESSION: 1. Right lateral pituitary mass (10 mm): macroadenoma versus complex cyst with mild regional mass effect. Referral to Endocrinology or Neurosurgery  is recommended for further evaluation and potential treatment planning. This follows ACR consensus guidelines: Management of Incidental Pituitary Findings on CT, MRI and F18-FDG PET: A White Paper of the ACR Incidental Findings Committee. J Am Coll Radiol 2018; 15: 966-72. 2. Otherwise  Normal MRI appearance of the brain.  Electronically signed by: Helayne Hurst MD 09/14/2024 01:17 PM EST RP Workstation: [id number]  Results for orders placed or performed during the hospital encounter of 01/17/23  CT Head Wo Contrast   Narrative   CLINICAL DATA:  Intermittent headaches, some photosensitivity, some nausea  EXAM: CT HEAD WITHOUT CONTRAST  TECHNIQUE: Contiguous axial images were obtained from the base of the skull through the vertex without intravenous contrast.  RADIATION DOSE REDUCTION: This exam was performed according to the departmental dose-optimization program which includes automated exposure control, adjustment of the mA and/or kV according to patient size and/or use of iterative reconstruction technique.  COMPARISON:  None Available.  FINDINGS: Brain: No evidence of acute infarction, hemorrhage, mass, mass effect, or midline shift. No hydrocephalus or extra-axial fluid collection. Normal pituitary and craniocervical junction.  Vascular: No hyperdense vessel.  Skull: Negative for fracture or focal lesion.  Sinuses/Orbits: Minimal mucosal thickening in the left maxillary sinus. Otherwise clear paranasal sinuses. No acute finding in the orbits.  Other: The mastoid air cells are well aerated.  IMPRESSION: No acute intracranial process. No etiology is seen for the patient's symptoms.   Electronically Signed   By: Donald Campion M.D.   On: 01/17/2023 19:25        [1]  Allergies Allergen Reactions   Lexapro  [Escitalopram ] Palpitations   "

## 2024-09-19 ENCOUNTER — Other Ambulatory Visit

## 2024-09-19 ENCOUNTER — Encounter: Payer: Self-pay | Admitting: Endocrinology

## 2024-09-19 ENCOUNTER — Ambulatory Visit: Admitting: Endocrinology

## 2024-09-19 VITALS — BP 122/84 | HR 85 | Resp 16 | Ht 67.0 in | Wt 207.0 lb

## 2024-09-19 DIAGNOSIS — E221 Hyperprolactinemia: Secondary | ICD-10-CM

## 2024-09-19 DIAGNOSIS — D352 Benign neoplasm of pituitary gland: Secondary | ICD-10-CM | POA: Diagnosis not present

## 2024-09-19 NOTE — Progress Notes (Addendum)
 "  Outpatient Endocrinology Note Zya Finkle, MD   Patient's Name: Katherine Parrish    DOB: Dec 31, 2000    MRN: [id number]  REASON OF VISIT: New consult for pituitary concern / hyperprolactinemia / pituitary adenoma.  REFERRING PROVIDER: Alvia Corean CROME, FNP   PCP: Alvia Corean CROME, FNP  HISTORY OF PRESENT ILLNESS:   Katherine Parrish is a 24 y.o. old female with past medical history listed below, is here for new consult for pituitary concerns / hyperprolactinemia / pituitary adenoma.   Pertinent Pituitary History: Patient is referred to endocrinology for evaluation and management of hyperprolactinemia / pituitary adenoma.  Initial consult on September 20, 2023.  Patient is having irregular menstrual cycle for about 3 months, evaluated by OB/GYN, had laboratory test in December 2025, initial prolactin was 136, repeated was 116 , and repeat on December 22 after holding desvenlafaxine  for 4 days, remains elevated at 94. Patient complains of occasional expressive galactorrhea for 3 years after she had Son in summer of 2022.  She has a history of migraine since 2022, no new headache.  She has not been taking antipsychotic medication.  She had normal thyroid  function test.  Normal liver enzymes and normal renal function.  No easy bruising, no unusual growth or significant weight loss or weight gain.  No features suggestive of hypercortisolism.  -MRI pituitary on September 14, 2024  : right lateral pituitary mass (10 mm): macroadenoma versus complex cyst with mild regional mass effect.  No mass effect on the optic chiasm.  Patient has been on IUD since September 01, 2024, no plan for pregnancy in the near future.  Patient was seen by neurosurgery on September 17, 2024 : appropriately no plan for surgery, noted plan to follow-up in 6 months.  Labs:  Latest Reference Range & Units 09/08/24 08:38  Prolactin ng/mL 94.3 (H)  (H): Data is abnormally high  MRI pituitary on September 14, 2024 :  - Right  lateral pituitary mass (10 mm): macroadenoma versus complex cyst with mild regional mass effect.  No mass effect on the optic chiasm  CLINICAL HISTORY: 24 year old female with 3 months of elevated prolactin levels.   FINDINGS:   BRAIN AND VENTRICLES: No acute infarct. No acute intracranial hemorrhage. No mass effect or midline shift. No hydrocephalus. Normal flow voids. The major dural venous sinuses are enhancing and appear patent. Normal brain volume. Normal background gray and white matter signal. No encephalomalacia or chronic cerebral blood products identified. No abnormal gray or white matter enhancement. No dural thickening. Negative visible cervical spine.   ORBITS: No abnormality.   SINUSES: Scattered mild paranasal sinus mucosal thickening. Incidental small nasopharynx retention cysts (normal variant). Mastoids are clear. Normal visible internal auditory structures.   BONES AND SOFT TISSUES: Normal bone marrow signal and enhancement. Bone marrow signal at the skull base remains normal. No acute soft tissue abnormality.   PITUITARY: Round and hypoenhancing mass occupying the right aspect of the sella turcica is heterogeneously T1 and T2 hyperintense prior to contrast, and measures 10 x 9 x 10 mm (AP x transverse x CC). Questionable T2 hypointense blood products within the lesion (series 9 image 12). This displaces the more homogeneously enhancing pituitary gland and infundibulum laterally to the left (series 26 image 6). Suprasellar cistern is partially effaced but no mass effect on the optic chiasm (series 16 image 6). Early extension into the right cavernous sinus is difficult to exclude on series 26 image 8, but no overt invasion. Left cavernous sinus is normal. Pneumatized  left anterior clinoid process incidentally noted (normal variant). Hypothalamus remains normal. Underlying clivus and posterior walls of the sphenoid sinuses appear intact.   IMPRESSION: 1.  Right lateral pituitary mass (10 mm): macroadenoma versus complex cyst with mild regional mass effect. Referral to Endocrinology or Neurosurgery is recommended for further evaluation and potential treatment planning. This follows ACR consensus guidelines: Management of Incidental Pituitary Findings on CT, MRI and F18-FDG PET: A White Paper of the ACR Incidental Findings Committee. J Am Coll Radiol 2018; 15: 966-72. 2. Otherwise  Normal MRI appearance of the brain.  Interval history Initial visit today.  REVIEW OF SYSTEMS:  As per history of present illness.   PAST MEDICAL HISTORY: Past Medical History:  Diagnosis Date   Asthma    Chronic hypertension with superimposed pre-eclampsia 03/14/2021   Hypertension    Vaginal delivery 03/15/2021    PAST SURGICAL HISTORY: Past Surgical History:  Procedure Laterality Date   HERNIA REPAIR      ALLERGIES: Allergies[1]  FAMILY HISTORY:  Family History  Problem Relation Age of Onset   Uterine cancer Mother 23       leiomyosarcoma; d. 48   Hypertension Mother    Thyroid  disease Mother    Anxiety disorder Mother    Diabetes Mellitus II Father    Breast cancer Maternal Grandmother        dx 30s/40s   Cancer Paternal Grandfather        dx 50s; unknown primary; ? 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, [name]   Right handed   Social Drivers of  Health   Tobacco Use: Low Risk (09/19/2024)   Patient History    Smoking Tobacco Use: Never    Smokeless Tobacco Use: Never    Passive Exposure: Not on file  Financial Resource Strain: Low Risk (09/09/2024)   Overall Financial Resource Strain (CARDIA)    Difficulty of Paying Living Expenses: Not hard at all  Food Insecurity: Not on file  Transportation Needs: No Transportation Needs (09/09/2024)   Epic    Lack of Transportation (Medical): No    Lack of Transportation (Non-Medical): No  Physical Activity: Not on file  Stress: Not on file  Social Connections: Unknown (03/14/2024)   Social Connection and Isolation Panel    Frequency of Communication with Friends and Family: Not on file    Frequency of Social Gatherings with Friends and Family: Not on file    Attends Religious Services: Not on file    Active Member of Clubs or Organizations: No    Attends Banker Meetings: Not on file    Marital Status: Not on file  Depression (PHQ2-9): Medium Risk (05/02/2023)   Depression (PHQ2-9)    PHQ-2 Score: 7  Alcohol Screen: Low Risk (09/09/2024)   Alcohol Screen    Last Alcohol Screening Score (AUDIT): 3  Housing: Not on file  Utilities: Not At Risk (09/09/2024)   Epic    Threatened with loss of utilities: No  Health Literacy: Adequate Health Literacy (09/09/2024)   B1300 Health Literacy    Frequency of need for help with medical instructions: Never    MEDICATIONS:  Current Outpatient Medications  Medication Sig Dispense Refill   Albuterol -Budesonide  (AIRSUPRA ) 90-80 MCG/ACT AERO Inhale 2 puffs into the lungs every 4 (four) hours as needed. 10.7 g 3   amLODipine  (NORVASC ) 5 MG tablet Take 1 tablet (5 mg total) by mouth daily. 30 tablet 5   Atogepant  (QULIPTA ) 60 MG TABS Take 1 tablet (60 mg total) by mouth daily. 30 tablet 5   desvenlafaxine  (PRISTIQ ) 100 MG 24 hr tablet Take 1 tablet (100 mg total) by mouth daily. 90 tablet 1   levocetirizine (XYZAL ) 5 MG tablet Take 1  tablet (5 mg total) by mouth every evening. 90 tablet 1   methylphenidate  18 MG PO CR tablet Take 1 tablet (18 mg total) by mouth daily. 30 tablet 0   montelukast  (SINGULAIR ) 10 MG tablet Take 1 tablet (10 mg total) by mouth at bedtime. 30 tablet 5   No current facility-administered medications for this visit.    PHYSICAL EXAM: Vitals:   09/19/24 0807  BP: 122/84  Pulse: 85  Resp: 16  SpO2: 97%  Weight: 207 lb (93.9 kg)  Height: 5' 7 (1.702 m)   Body mass index is 32.42 kg/m.  Wt Readings from Last 3 Encounters:  09/19/24 207 lb (93.9 kg)  09/17/24 205 lb (93 kg)  07/02/24 220 lb (99.8 kg)    General: Well developed, well nourished female in no apparent distress. No cushingoid and acromegalic appearance HEENT: AT/[place], no external lesions. Hearing intact to the spoken word Eyes: EOMI. Conjunctiva clear and no icterus. Visual Field by confrontation grossly intact Neck: Trachea midline, neck supple without appreciable thyromegaly or lymphadenopathy and no palpable thyroid  nodules Lungs: Clear to auscultation, no wheeze. Respirations not labored Heart: S1S2, Regular in rate and rhythm.  Abdomen: Soft, non tender, non distended, no striae.  Stretch marks present. Neurologic: Alert, oriented, normal speech, deep tendon biceps reflexes normal,  no gross focal neurological deficit Extremities: No pedal pitting edema, no tremors of outstretched hands Skin: Warm, color good.  No bruises.   Psychiatric: Does not appear depressed or anxious  PERTINENT HISTORIC LABORATORY AND IMAGING STUDIES:  All pertinent laboratory results were reviewed. Please see HPI also for further details.   ASSESSMENT / PLAN  1. Hyperprolactinemia   2. Pituitary adenoma [facility])    Patient has hyperprolactinemia, noted in December 2025 in the range of 94-136.  She has symptoms of occasional expressive galactorrhea and irregular menstrual cycle.  MRI pituitary on September 14, 2024 showed pituitary adenoma  measuring 10 mm, with no mass effect on the optic chiasma.  She has not been taking antipsychotic medication.  Desvenlafaxine  is unlikely to cause hyperprolactinemia.  Overall she most likely has hyperprolactinemia due to pituitary adenoma / prolactinoma.  I like to complete following labs today, she is fasting.  Patient was seen by neurosurgery, appropriately not planning for surgery.  She has no obvious features suggestive of hypercortisolism, acromegaly or hypopituitarism or hyperpituitarism, except hyperprolactinemia.  Plan: - Check prolactin and macroprolactin.  Macroprolactin can elevate prolactin level on the lab test however is not physiologically active. - Check ACTH , cortisol  and IGF-I for evaluation of pituitary adenoma.  Will check serum hCG pregnancy test. - Will not check gonadotropin  and estradiol, she is currently on IUD. - If the macroprolactin is negative and if she continues to have elevated prolactin will plan for dopamine receptor agonist medication called cabergoline .  Discussed about potential side effects.  Treatment for hyperprolactinemia with cabergoline , is expected to help to shrink the prolactinoma. - Repeat MRI pituitary in 6 to 12 months timeframe. -Rest of the management plan based on test results from today. - Will follow-up in 3 months.  Diagnoses and all orders for this visit:  Hyperprolactinemia -     Prolactin, Total and Monomeric -     hCG, quantitative, pregnancy  Pituitary adenoma (HCC) -     ACTH  -     Cortisol -     Insulin -like growth factor    DISPOSITION Follow up in clinic in 3 months suggested.  All questions answered and patient verbalized understanding of the plan.  Tyshon Fanning, MD [facility] Endocrinology [facility] [address] [place], [address] [place], [place] [address] Phone # [phone]  At least part of this note was generated using voice recognition software. Inadvertent word errors may have occurred, which were  not recognized during the proofreading process.  Labs reviewed: Negative for macroprolactin, with elevated prolactin consistent with hyperprolactinemia.  Normal ACTH , cortisol hormone.  Start cabergoline  0.25 mg twice a week for treatment of hyperprolactinemia.  Check prolactin level 1 week prior to follow-up visit in April.    Latest Reference Range & Units 09/19/24 08:41  C206 ACTH  6 - 50 pg/mL 12  Cortisol, Plasma mcg/dL 88.1   IGF-I, LC/MS 834  Z-Score (Female) -0.3   Prolactin, Total 69.6 High   Comment: . Females (>18 years)   Non-pregnant:     3.0-30.0 ng/mL   Pregnant:        10.0-209.0 ng/mL   Post-menopausal:  2.0-20.0 ng/mL  Prolactin, Monomeric 55.6 High         [1]  Allergies Allergen Reactions   Lexapro  [Escitalopram ] Palpitations   "

## 2024-09-22 ENCOUNTER — Telehealth: Payer: Self-pay | Admitting: Emergency Medicine

## 2024-09-22 NOTE — Telephone Encounter (Signed)
 Pt lvm stating she wants to taper off Pristiq  and get on something else. Wants to discuss options before appt.

## 2024-09-23 NOTE — Telephone Encounter (Signed)
 LVM to [facility]

## 2024-09-24 LAB — ACTH: C206 ACTH: 12 pg/mL (ref 6–50)

## 2024-09-24 LAB — CORTISOL: Cortisol, Plasma: 11.8 ug/dL

## 2024-09-24 LAB — INSULIN-LIKE GROWTH FACTOR
IGF-I, LC/MS: 165 ng/mL (ref 83–456)
Z-Score (Female): -0.3 {STDV}

## 2024-09-24 LAB — HCG, QUANTITATIVE, PREGNANCY: HCG, Total, QN: <5 m[IU]/mL

## 2024-09-24 LAB — PROLACTIN, TOTAL AND MONOMERIC
Prolactin, Monomeric: 55.6 ng/mL — ABNORMAL HIGH (ref 3.2–25.2)
Prolactin, Total: 69.6 ng/mL — ABNORMAL HIGH

## 2024-09-24 NOTE — Telephone Encounter (Signed)
 Pt was seen by you for initial visit on 12/23. She has been prescribed Pristiq  100 mg by another provider. Msg left said she wanted to taper off, but when I called her she said she didn't know if she needed to come off of it or add something to it. She said she knew that a possible SE was brain fog and she reports it is getting worse. She has FU 1/21.

## 2024-09-24 NOTE — Telephone Encounter (Signed)
 SABRA

## 2024-09-25 ENCOUNTER — Ambulatory Visit: Payer: Self-pay | Admitting: Endocrinology

## 2024-09-25 MED ORDER — CABERGOLINE 0.5 MG PO TABS: 0.2500 mg | ORAL_TABLET | ORAL | 3 refills | Status: AC

## 2024-09-25 NOTE — Addendum Note (Signed)
 Addended by: Valora Norell on: 09/25/2024 03:27 PM   Modules accepted: Orders

## 2024-09-30 NOTE — Telephone Encounter (Signed)
 I had ordered her for 12 tablets which will be for 60-month supply.  You may have received 1 month supply with 4 tablets.  I will continue on half tablet twice a week until planned lab in early April.  Until then you will continue the current dose.

## 2024-10-01 ENCOUNTER — Other Ambulatory Visit (HOSPITAL_COMMUNITY): Payer: Self-pay

## 2024-10-01 ENCOUNTER — Telehealth: Admitting: Emergency Medicine

## 2024-10-01 ENCOUNTER — Other Ambulatory Visit: Payer: Self-pay

## 2024-10-01 ENCOUNTER — Encounter: Payer: Self-pay | Admitting: Emergency Medicine

## 2024-10-01 DIAGNOSIS — F329 Major depressive disorder, single episode, unspecified: Secondary | ICD-10-CM | POA: Diagnosis not present

## 2024-10-01 DIAGNOSIS — F909 Attention-deficit hyperactivity disorder, unspecified type: Secondary | ICD-10-CM

## 2024-10-01 DIAGNOSIS — F411 Generalized anxiety disorder: Secondary | ICD-10-CM

## 2024-10-01 DIAGNOSIS — F419 Anxiety disorder, unspecified: Secondary | ICD-10-CM

## 2024-10-01 DIAGNOSIS — F331 Major depressive disorder, recurrent, moderate: Secondary | ICD-10-CM

## 2024-10-01 MED ORDER — METHYLPHENIDATE HCL ER (OSM) 27 MG PO TBCR
27.0000 mg | EXTENDED_RELEASE_TABLET | Freq: Every day | ORAL | 0 refills | Status: AC
  Filled 2024-10-01: qty 30, 30d supply, fill #0

## 2024-10-01 MED ORDER — DESVENLAFAXINE SUCCINATE ER 100 MG PO TB24
100.0000 mg | ORAL_TABLET | Freq: Every day | ORAL | 1 refills | Status: AC
  Filled 2024-10-01: qty 30, 30d supply, fill #0

## 2024-10-01 NOTE — Progress Notes (Addendum)
 Katherine Parrish [id number] 05-17-2001 24 y.o.  Virtual Visit via Telephone Note  I connected with pt on 10/01/2024 at 11:30 AM EST by telephone and verified that I am speaking with the correct person using two identifiers.   I discussed the limitations, risks, security and privacy concerns of performing an evaluation and management service by telephone and the availability of in person appointments. I also discussed with the patient that there may be a patient responsible charge related to this service. The patient expressed understanding and agreed to proceed.   I discussed the assessment and treatment plan with the patient. The patient was provided an opportunity to ask questions and all were answered. The patient agreed with the plan and demonstrated an understanding of the instructions.   The patient was advised to call back or seek an in-person evaluation if the symptoms worsen or if the condition fails to improve as anticipated.  I provided 30 minutes of non-face-to-face time during this encounter.  The patient was located at home.  The provider was located at [facility].   Isidoro Chatters, PA-C   Subjective:   Patient ID:  Katherine Parrish is a 24 y.o. (DOB 30-Dec-2000) female.  Chief Complaint:  Chief Complaint  Patient presents with   Follow-up   HPI [name] presents for follow-up of for routine medication management.  Current Psych Medication Regimen: No SE reported. Pristiq  100 mg po daily Methylphenidate  18 mg po daily  Pt is doing well since LOV. She notes mild improvement in concentration and focus at work with adding methylphenidate . She requests dose increase due to persistent difficulty completing household chores and procrastination. No irritability noted (in contrast to prior Vyvanse  trial).   Depressive symptoms remain controlled without sadness, tearfulness, or hopelessness. Energy and motivation described as okay. Anxiety better managed with  reduced worry about being a good, less restlessness, and decreased guilt around self-care; no panic attacks reported. Recent self-care includes massage, new pajamas, and phone case purchase. She is being intentional with adding coping strategies. Sleep improved; appetite stable with normal weight; ADLs intact.  Patient follows with endocrinology for new hyperprolactinemia dx. MRI revealed possible tumor versus scar tissue. She started cabergoline  0.25 mg PO BID on Monday. Reports new-onset brain fog over past week, attributed to elevated prolactin and stress dx. Repeat labs planned for April; MRI in 6 months.   Continues monthly therapy with Vina via [facility] at [facility], which remains beneficial.  Denies mania, delirium, AVH, SI, HI, or self-harm behaviors. No further complaints at this time.   Review of Systems:  Review of Systems  Psychiatric/Behavioral:         Please refer to HPI.  All other systems reviewed and are negative.  Medications: I have reviewed the patient's current medications.  Buspar  Lexapro  Vyvanse  Zoloft  Wellbutrin  Prozac     Current Outpatient Medications  Medication Sig Dispense Refill   Albuterol -Budesonide  (AIRSUPRA ) 90-80 MCG/ACT AERO Inhale 2 puffs into the lungs every 4 (four) hours as needed. 10.7 g 3   amLODipine  (NORVASC ) 5 MG tablet Take 1 tablet (5 mg total) by mouth daily. 30 tablet 5   Atogepant  (QULIPTA ) 60 MG TABS Take 1 tablet (60 mg total) by mouth daily. 30 tablet 5   cabergoline  (DOSTINEX ) 0.5 MG tablet Take 0.5 tablets (0.25 mg total) by mouth 2 (two) times a week. 12 tablet 3   desvenlafaxine  (PRISTIQ ) 100 MG 24 hr tablet Take 1 tablet (100 mg total) by mouth daily. 90 tablet 1   levocetirizine (  XYZAL ) 5 MG tablet Take 1 tablet (5 mg total) by mouth every evening. 90 tablet 1   methylphenidate  18 MG PO CR tablet Take 1 tablet (18 mg total) by mouth daily. 30 tablet 0   montelukast  (SINGULAIR ) 10 MG tablet Take 1 tablet (10 mg total) by  mouth at bedtime. 30 tablet 5   No current facility-administered medications for this visit.    Medication Side Effects: None  Allergies: Allergies[1]  Past Medical History:  Diagnosis Date   Asthma    Chronic hypertension with superimposed pre-eclampsia 03/14/2021   Hypertension    Vaginal delivery 03/15/2021    Family History  Problem Relation Age of Onset   Uterine cancer Mother 27       leiomyosarcoma; d. 74   Hypertension Mother    Thyroid  disease Mother    Anxiety disorder Mother    Diabetes Mellitus II Father    Breast cancer Maternal Grandmother        dx 30s/40s   Cancer Paternal Grandfather        dx 53s; unknown primary; ? 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   64 month old son, Katherine Parrish   Right handed   Social Drivers of Health   Tobacco Use: Low Risk (09/19/2024)   Patient History    Smoking Tobacco Use: Never    Smokeless Tobacco Use: Never    Passive Exposure: Not on file  Financial Resource Strain: Low Risk (09/09/2024)   Overall Financial Resource Strain (CARDIA)    Difficulty of Paying Living Expenses: Not hard at all  Food Insecurity: Not on file  Transportation Needs: No Transportation Needs (09/09/2024)   Epic    Lack of Transportation (Medical): No    Lack of Transportation (Non-Medical): No  Physical Activity: Not on file  Stress: Not on file  Social Connections: Unknown (03/14/2024)   Social  Connection and Isolation Panel    Frequency of Communication with Friends and Family: Not on file    Frequency of Social Gatherings with Friends and Family: Not on file    Attends Religious Services: Not on file    Active Member of Clubs or Organizations: No    Attends Banker Meetings: Not on file    Marital Status: Not on file  Intimate Partner Violence: Not on file  Depression (PHQ2-9): Medium Risk (05/02/2023)   Depression (PHQ2-9)    PHQ-2 Score: 7  Alcohol Screen: Low Risk (09/09/2024)   Alcohol Screen    Last Alcohol Screening Score (AUDIT): 3  Housing: Not on file  Utilities: Not At Risk (09/09/2024)   Epic    Threatened with loss of utilities: No  Health Literacy: Adequate Health Literacy (09/09/2024)   B1300 Health Literacy    Frequency of need for help  with medical instructions: Never    Past Medical History, Surgical history, Social history, and Family history were reviewed and updated as appropriate.   Please see review of systems for further details on the patient's review from today.   Objective:   Physical Exam:  There were no vitals taken for this visit.  Physical Exam Constitutional:      Appearance: Normal appearance. She is normal weight.  Neurological:     General: No focal deficit present.     Mental Status: She is alert and oriented to person, place, and time. Mental status is at baseline.  Psychiatric:        Mood and Affect: Mood normal.        Behavior: Behavior normal.        Thought Content: Thought content normal.        Judgment: Judgment normal.     Lab Review:     Component Value Date/Time   NA 140 05/08/2024 0926   NA 141 05/03/2023 0850   K 3.8 05/08/2024 0926   CL 104 05/08/2024 0926   CO2 29 05/08/2024 0926   GLUCOSE 94 05/08/2024 0926   BUN 14 05/08/2024 0926   BUN 13 05/03/2023 0850   CREATININE 0.75 05/08/2024 0926   CREATININE 0.64 06/09/2022 1210   CALCIUM 9.4 05/08/2024 0926   PROT 7.7 05/08/2024 0926    PROT 7.4 05/03/2023 0850   ALBUMIN 4.2 05/08/2024 0926   ALBUMIN 4.3 05/03/2023 0850   AST 22 05/08/2024 0926   AST 18 06/09/2022 1210   ALT 13 05/08/2024 0926   ALT 12 06/09/2022 1210   ALKPHOS 61 05/08/2024 0926   BILITOT 0.3 05/08/2024 0926   BILITOT 0.3 05/03/2023 0850   BILITOT 0.4 06/09/2022 1210   GFRNONAA >60 01/17/2023 2004   GFRNONAA >60 06/09/2022 1210       Component Value Date/Time   WBC 3.8 (L) 05/08/2024 0926   RBC 4.21 05/08/2024 0926   HGB 11.5 (L) 05/08/2024 0926   HGB 11.0 (L) 08/06/2023 1534   HCT 35.8 (L) 05/08/2024 0926   HCT 35.0 08/06/2023 1534   PLT 133.0 (L) 05/08/2024 0926   PLT 150 08/06/2023 1534   MCV 85.0 05/08/2024 0926   MCV 87 08/06/2023 1534   MCH 27.2 08/06/2023 1534   MCH 28.2 01/17/2023 2004   MCHC 32.1 05/08/2024 0926   RDW 15.2 05/08/2024 0926   RDW 15.0 08/06/2023 1534   LYMPHSABS 1.6 05/08/2024 0926   LYMPHSABS 2.4 08/06/2023 1534   MONOABS 0.4 05/08/2024 0926   EOSABS 0.3 05/08/2024 0926   EOSABS 0.2 08/06/2023 1534   BASOSABS 0.0 05/08/2024 0926   BASOSABS 0.0 08/06/2023 1534    No results found for: POCLITH, LITHIUM   No results found for: PHENYTOIN, PHENOBARB, VALPROATE, CBMZ   .res Assessment: Plan:    There are no diagnoses linked to this encounter.  Please see After Visit Summary for patient specific instructions.  Future Appointments  Date Time Provider Department Center  12/17/2024  8:15 AM [facility] None  12/25/2024  9:20 AM Thapa, Sudan, MD [facility] None  01/13/2025  8:50 AM Skeet Juliene SAUNDERS, DO [facility] None  03/17/2025  8:20 AM Rosslyn Dino HERO, MD [facility] None    No orders of the defined types were placed in this encounter.    Discussed dx and tx plan. Discussed alternative options including therapy.  PDMP reviewed: low risk trend   Patient follows with endocrinology for new hyperprolactinemia dx. MRI revealed  possible tumor versus scar tissue. She started cabergoline   0.25 mg PO BID on Monday. Repeat labs planned for April by endo; MRI in 6 months. Do not want to add any new medications at this time.  Depression/Anxiety - controlled Continue Pristiq  100 mg po daily Refilled Advised to keep a medication side effect journal to systematically track any symptoms, noting onset, frequency, severity, and context of side effects. Continuation of psychotherapy with  at [facility] was strongly recommended as important adjunct treatment to medication    ADD - not controlled Increase methylphenidate  to 27 mg po daily  Will consider increasing to 36 mg po daily if tolerated and symptoms persist   Monitor SE  including BP, HR, sleep, appetite, anxiety, GI upset  Discussed organizational strategies planners, time management, and mindfulness techniques to support ADHD symptoms  FOLLOW UP: 4 weeks or sooner if clinically indicated.  Risks, benefits, and alternatives of the medications and treatment plan prescribed today were discussed. Instructed patient to contact office or go to ED if experiencing any significant tolerability issues. Patient engaged in shared decision-making;treatment plan reviewed and agreed upon.   Jovante Hammitt PA-C, DMSc    -------------------------------      [1]  Allergies Allergen Reactions   Lexapro  [Escitalopram ] Palpitations

## 2024-10-08 ENCOUNTER — Ambulatory Visit: Admitting: Emergency Medicine

## 2024-10-29 ENCOUNTER — Ambulatory Visit: Admitting: Emergency Medicine

## 2024-12-17 ENCOUNTER — Other Ambulatory Visit

## 2024-12-25 ENCOUNTER — Ambulatory Visit: Admitting: Endocrinology

## 2025-01-13 ENCOUNTER — Ambulatory Visit: Admitting: Neurology

## 2025-03-17 ENCOUNTER — Ambulatory Visit: Admitting: Neurosurgery
